# Patient Record
Sex: Male | Born: 1935 | Race: White | Hispanic: No | Marital: Married | Smoking: Former smoker
Health system: Southern US, Community
[De-identification: ages and names within clinical notes are randomized; demographics above are authoritative.]

## PROBLEM LIST (undated history)

## (undated) DIAGNOSIS — I341 Nonrheumatic mitral (valve) prolapse: Secondary | ICD-10-CM

## (undated) DIAGNOSIS — I10 Essential (primary) hypertension: Secondary | ICD-10-CM

## (undated) DIAGNOSIS — I351 Nonrheumatic aortic (valve) insufficiency: Secondary | ICD-10-CM

## (undated) DIAGNOSIS — I4819 Other persistent atrial fibrillation: Secondary | ICD-10-CM

## (undated) DIAGNOSIS — K4090 Unilateral inguinal hernia, without obstruction or gangrene, not specified as recurrent: Secondary | ICD-10-CM

## (undated) DIAGNOSIS — I7121 Aneurysm of the ascending aorta, without rupture: Secondary | ICD-10-CM

## (undated) DIAGNOSIS — I4821 Permanent atrial fibrillation: Principal | ICD-10-CM

## (undated) DIAGNOSIS — I712 Thoracic aortic aneurysm, without rupture: Secondary | ICD-10-CM

## (undated) HISTORY — DX: Permanent atrial fibrillation: I48.21

## (undated) HISTORY — DX: Thoracic aortic aneurysm, without rupture: I71.2

## (undated) HISTORY — DX: Nonrheumatic mitral (valve) prolapse: I34.1

## (undated) HISTORY — DX: Aneurysm of the ascending aorta, without rupture: I71.21

## (undated) HISTORY — DX: Essential (primary) hypertension: I10

## (undated) HISTORY — DX: Other persistent atrial fibrillation: I48.19

## (undated) HISTORY — DX: Nonrheumatic aortic (valve) insufficiency: I35.1

---
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
## 2007-04-22 ENCOUNTER — Emergency Department (HOSPITAL_COMMUNITY): Admission: EM | Admit: 2007-04-22 | Discharge: 2007-04-23 | Payer: Self-pay | Admitting: Emergency Medicine

## 2008-12-27 HISTORY — PX: CARPAL TUNNEL RELEASE: SHX101

## 2009-07-17 ENCOUNTER — Ambulatory Visit (HOSPITAL_BASED_OUTPATIENT_CLINIC_OR_DEPARTMENT_OTHER): Admission: RE | Admit: 2009-07-17 | Discharge: 2009-07-17 | Payer: Self-pay | Admitting: Orthopedic Surgery

## 2009-12-27 HISTORY — PX: OTHER SURGICAL HISTORY: SHX169

## 2010-12-15 ENCOUNTER — Ambulatory Visit
Admission: RE | Admit: 2010-12-15 | Discharge: 2010-12-15 | Payer: Self-pay | Source: Home / Self Care | Attending: Orthopedic Surgery | Admitting: Orthopedic Surgery

## 2011-04-04 LAB — BASIC METABOLIC PANEL
BUN: 32 mg/dL — ABNORMAL HIGH (ref 6–23)
Chloride: 106 mEq/L (ref 96–112)
Glucose, Bld: 128 mg/dL — ABNORMAL HIGH (ref 70–99)
Potassium: 3.7 mEq/L (ref 3.5–5.1)

## 2011-05-11 NOTE — Op Note (Signed)
Brandon Dean, Brandon Dean              ACCOUNT NO.:  [id number]   MEDICAL RECORD NO.:  [id number]          PATIENT TYPE:  AMB   LOCATION:  DSC                          FACILITY:  [facility]   PHYSICIAN:  Katy Fitch. Sypher, M.D. DATE OF BIRTH:  10-19-1936   DATE OF PROCEDURE:  07/17/2009  DATE OF DISCHARGE:                               OPERATIVE REPORT   PREOPERATIVE DIAGNOSIS:  Severe left carpal tunnel syndrome with  electrodiagnostic studies obtained June 25, 2009 documenting  prolongation of the motor and sensory latencies of the left median nerve  across the carpal canal at a profoundly abnormal level.   POSTOPERATIVE DIAGNOSIS:  Severe left carpal tunnel syndrome with  electrodiagnostic studies obtained June 25, 2009 documenting  prolongation of the motor and sensory latencies of the left median nerve  across the carpal canal at a profoundly abnormal level.   OPERATION:  Release of left transverse carpal ligament and exploration  of left thumb motor branch.   SURGEON:  Katy Fitch. Sypher, MD   ASSISTANT:  Marveen Reeks Dasnoit, PA-C   ANESTHESIA:  General by mask.   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  He presented for evaluation of his  hands specifically left hand complaints on June 19, 2009.  Dr. Waynard Edwards  reported a history of injuring his left upper extremity in a fall  November 17, 2008.  He wore a splint for a period of time.  Thereafter,  he noted loss of sensibility in his fingers and dysesthesias when he  would sleep at night.   He presented for upper extremity evaluation and was noted to have thenar  atrophy on the left and diminished sensibility in the median  distribution left and right.   He was referred to see Dr. Wadie Lessen for detailed electrodiagnostic  studies on June 25, 2009.  These documented a motor latency of 11.3  milliseconds on the  left, a sensory latency of 4.2 milliseconds and  severely diminished motor amplitude and sensory amplitudes on the left.   We advised Dr. Waynard Edwards to proceed with release of his left transverse  carpal ligament.  Preoperatively, we reminded him that with his degree  of severe neuropathy, we would need to wait at least 73 months to see the  recovery of his thenar musculature and improved sensibility.   With median neuropathy at this level we cannot guarantee complete  recovery.   After informed consent he is brought to the operating room at this time.   Initially, he was of the opinion that he might want regional anesthesia.  After a detailed informed consent by Dr. Jacklynn Bue, he elected to proceed  with general anesthesia.  After informed consent he is brought to the  operating room at this time.   PROCEDURE:  Ildefonso Keaney is brought to the operating room and placed  in supine position upon the operating table.   Following the induction of general anesthesia by mask technique, the  left arm was prepped with Betadine soap and solution  and sterilely  draped.  The left arm was exsanguinated with an Esmarch bandage and the  arterial tourniquet on the proximal left brachium inflated to 250 mmHg.  The procedure commenced with a short incision in the line of the ring  finger and the palm.  Subcutaneous tissues were carefully divided  revealing the palmar fascia.  This was split longitudinally to reveal  the common sensory branch of the median nerve.  These were followed back  to the transverse carpal ligament which was gently isolated from the  median nerve with a Insurance risk surveyor.   The transverse carpal ligament was released with scissors along its  ulnar border extending into the distal forearm.  The volar forearm  fascia was also released subcutaneously.   This widely opened the carpal canal.   The median nerve was invested in hypertrophic tenosynovium of the ulnar  bursa.  No  mass or predicaments were noted.   The motor branch was inspected visually distally.  There did not appear  to be any significant impedance.   The wound was then repaired with intradermal 3-0 Prolene.  A compressive  dressing was applied with a volar plaster splint maintaining the wrist  in 5 degrees of dorsiflexion.   For aftercare, Dr. Waynard Edwards was written a prescription for Percocet 5 mg  one p.o. q. 4-6 h. p.r.n. pain 20 tablets without refill.      Katy Fitch Sypher, M.D.  Electronically Signed     RVS/MEDQ  D:  07/17/2009  T:  07/18/2009  Job:  [id number]   cc:   Loraine Leriche A. Gonzalo, M.D.  Larina Earthly, M.D.

## 2012-03-08 HISTORY — PX: US ECHOCARDIOGRAPHY: HXRAD669

## 2013-01-12 ENCOUNTER — Other Ambulatory Visit: Payer: Self-pay | Admitting: Internal Medicine

## 2013-01-12 DIAGNOSIS — R9389 Abnormal findings on diagnostic imaging of other specified body structures: Secondary | ICD-10-CM

## 2013-02-12 ENCOUNTER — Other Ambulatory Visit: Payer: Self-pay

## 2013-02-15 ENCOUNTER — Other Ambulatory Visit: Payer: Self-pay

## 2014-06-26 ENCOUNTER — Other Ambulatory Visit: Payer: Self-pay | Admitting: Internal Medicine

## 2014-06-26 DIAGNOSIS — I712 Thoracic aortic aneurysm, without rupture, unspecified: Secondary | ICD-10-CM

## 2014-11-12 ENCOUNTER — Other Ambulatory Visit: Payer: Self-pay

## 2015-01-14 ENCOUNTER — Encounter: Payer: Self-pay | Admitting: *Deleted

## 2015-01-15 ENCOUNTER — Other Ambulatory Visit: Payer: Self-pay

## 2015-01-23 ENCOUNTER — Ambulatory Visit (INDEPENDENT_AMBULATORY_CARE_PROVIDER_SITE_OTHER): Payer: Medicare Other | Admitting: Cardiovascular Disease

## 2015-01-23 ENCOUNTER — Encounter: Payer: Self-pay | Admitting: Cardiovascular Disease

## 2015-01-23 VITALS — BP 132/98 | HR 101 | Resp 16 | Ht 69.0 in | Wt 195.0 lb

## 2015-01-23 DIAGNOSIS — I482 Chronic atrial fibrillation: Secondary | ICD-10-CM

## 2015-01-23 DIAGNOSIS — I4821 Permanent atrial fibrillation: Secondary | ICD-10-CM

## 2015-01-23 NOTE — Patient Instructions (Signed)
Your physician has requested that you regularly monitor and record your blood pressure readings at home. Please use the same machine at the same time of day to check your readings and record them to bring to your follow-up visit.  Dr. Sallyanne Kuster recommends that you schedule a follow-up appointment in: One year.

## 2015-01-24 ENCOUNTER — Encounter: Payer: Self-pay | Admitting: Cardiovascular Disease

## 2015-01-24 DIAGNOSIS — I4821 Permanent atrial fibrillation: Secondary | ICD-10-CM

## 2015-01-24 HISTORY — DX: Permanent atrial fibrillation: I48.21

## 2015-01-24 NOTE — Progress Notes (Signed)
Patient ID: Brandon Dean, male   DOB: June 10, 1936, 79 y.o.   MRN: [id number]      Reason for office visit Permanent atrial fibrillation  [name] returns for asymptomatic long-standing persistent (probably permanent) atrial fibrillation in the setting of long-standing systemic hypertension, mild aortic insufficiency, mild mitral valve prolapse with minimal insufficiency and ectasia of the ascending aorta. His left atrium is severely dilated  He remains physically very active, although bilateral knee arthritis from years of playing soccer have been slowing him down. He denies problems with palpitations, dizziness, lower extremity edema, shortness of breath or chest discomfort. He does not have a history of stroke, TIA or other embolic events. He developed lower GI bleeding with Xarelto (probably hemorrhoidal) but is tolerating Eliquis without such issues. He has not had other bleeding complications. At home his blood pressure monitor typically reads around 1:30/80 mmHg and his heart rate is typically in the 80s. Today in the office both his heart rate and blood pressure are higher than usual.  No Known Allergies  Current Outpatient Prescriptions  Medication Sig Dispense Refill  . Cholecalciferol (VITAMIN D) 2000 UNITS tablet Take 2,000 Units by mouth daily.    Marland Kitchen ELIQUIS 5 MG TABS tablet Take 5 mg by mouth 2 (two) times daily.   1  . folic acid (FOLVITE) 595 MCG tablet Take 400 mcg by mouth 2 (two) times daily.    . metoprolol succinate (TOPROL-XL) 50 MG 24 hr tablet Take 50 mg by mouth daily. Take with or immediately following a meal.     No current facility-administered medications for this visit.    Past Medical History  Diagnosis Date  . Persistent atrial fibrillation   . Ascending aortic aneurysm   . MVP (mitral valve prolapse)   . Mild aortic insufficiency   . Systemic hypertension   . Permanent atrial fibrillation 01/24/2015    Past Surgical History  Procedure Laterality Date   . Orif clavicle fracture  1946    left  . Carpal tunnel release  1946    left  . US echocardiography  03/08/2012    severe left atrial dilatation, moderate right atrial dilatation,mild aortic sclerosis,mild AI, mild bileaflet MVP,trace MR,mild aortic root dilatation.    Family History  Problem Relation Age of Onset  . Cancer Mother     breast  . Heart attack Father   . Heart failure Brother   . Hypertension Sister     History   Social History  . Marital Status: Married    Spouse Name: N/A    Number of Children: N/A  . Years of Education: N/A   Occupational History  . Not on file.   Social History Main Topics  . Smoking status: Former Smoker    Quit date: 12/26/1966  . Smokeless tobacco: Not on file  . Alcohol Use: 0.0 oz/week    0 Not specified per week     Comment: 2 glasses wine daily  . Drug Use: No  . Sexual Activity: Not on file   Other Topics Concern  . Not on file   Social History Narrative    Review of systems: The patient specifically denies any chest pain at rest or with exertion, dyspnea at rest or with exertion, orthopnea, paroxysmal nocturnal dyspnea, syncope, palpitations, focal neurological deficits, intermittent claudication, lower extremity edema, unexplained weight gain, cough, hemoptysis or wheezing.  The patient also denies abdominal pain, nausea, vomiting, dysphagia, diarrhea, constipation, polyuria, polydipsia, dysuria, hematuria, frequency, urgency, abnormal bleeding or bruising,  fever, chills, unexpected weight changes, mood swings, change in skin or hair texture, change in voice quality, auditory or visual problems, allergic reactions or rashes, new musculoskeletal complaints other than usual "aches and pains".   PHYSICAL EXAM BP 132/98 mmHg  Pulse 101  Resp 16  Ht _0  (1.753 m)  Wt 195 lb (88.451 kg)  BMI 28.78 kg/m2  General: Alert, oriented x3, no distress Head: no evidence of trauma, PERRL, EOMI, no exophtalmos or lid lag, no  myxedema, no xanthelasma; normal ears, nose and oropharynx Neck: normal jugular venous pulsations and no hepatojugular reflux; brisk carotid pulses without delay and no carotid bruits Chest: clear to auscultation, no signs of consolidation by percussion or palpation, normal fremitus, symmetrical and full respiratory excursions Cardiovascular: normal position and quality of the apical impulse, irregular rhythm, normal first and second heart sounds, no murmurs, rubs or gallops Abdomen: no tenderness or distention, no masses by palpation, no abnormal pulsatility or arterial bruits, normal bowel sounds, no hepatosplenomegaly Extremities: no clubbing, cyanosis or edema; 2+ radial, ulnar and brachial pulses bilaterally; 2+ right femoral, posterior tibial and dorsalis pedis pulses; 2+ left femoral, posterior tibial and dorsalis pedis pulses; no subclavian or femoral bruits Neurological: grossly nonfocal   EKG: Atrial fibrillation with ventricular rate around the 100 bpm a single PVC is seen otherwise normal tracing  Lipid Panel  No results found for: CHOL, TRIG, HDL, CHOLHDL, VLDL, LDLCALC, LDLDIRECT  BMET    Component Value Date/Time   NA 139 07/14/2009 0935   K 3.7 07/14/2009 0935   CL 106 07/14/2009 0935   CO2 27 07/14/2009 0935   GLUCOSE 128* 07/14/2009 0935   BUN 32* 07/14/2009 0935   CREATININE 0.85 07/14/2009 0935   CALCIUM 9.2 07/14/2009 0935   GFRNONAA >60 07/14/2009 0935   GFRAA  07/14/2009 0935    >60        The eGFR has been calculated using the MDRD equation. This calculation has not been validated in all clinical situations. eGFR's persistently <60 mL/min signify possible Chronic Kidney Disease.     ASSESSMENT AND PLAN  [name] has asymptomatic atrial fibrillation without a history of embolic events, compliant with effective anticoagulation. He usually has good rate control as well as good blood pressure control, but today both heart rate and blood pressure higher  than average.  Asked him to check his resting, relaxed blood pressure and heart rate once a day and send me the recordings either via the [email or web address] website or mail.  Orders Placed This Encounter  Procedures  . EKG 12-Lead   Meds ordered this encounter  Medications  . ELIQUIS 5 MG TABS tablet    Sig: Take 5 mg by mouth 2 (two) times daily.     Refill:  1  . Cholecalciferol (VITAMIN D) 2000 UNITS tablet    Sig: Take 2,000 Units by mouth daily.    Holli Humbles, MD, [place] [phone] office [phone] pager

## 2015-01-28 ENCOUNTER — Encounter: Payer: Self-pay | Admitting: Cardiovascular Disease

## 2015-02-25 ENCOUNTER — Other Ambulatory Visit: Payer: Self-pay | Admitting: Internal Medicine

## 2015-02-25 DIAGNOSIS — I712 Thoracic aortic aneurysm, without rupture, unspecified: Secondary | ICD-10-CM

## 2015-02-26 ENCOUNTER — Ambulatory Visit
Admission: RE | Admit: 2015-02-26 | Discharge: 2015-02-26 | Disposition: A | Discharge status: Home / Self Care | Payer: Medicare Other | Source: Ambulatory Visit | Attending: Internal Medicine | Admitting: Internal Medicine

## 2015-02-26 DIAGNOSIS — I712 Thoracic aortic aneurysm, without rupture, unspecified: Secondary | ICD-10-CM

## 2015-02-26 MED ORDER — IOHEXOL 350 MG/ML SOLN
75.0000 mL | Freq: Once | INTRAVENOUS | Status: AC | PRN
Start: 2015-02-26 — End: 2015-02-26
  Administered 2015-02-26: 75 mL via INTRAVENOUS

## 2015-03-12 ENCOUNTER — Encounter: Payer: Self-pay | Admitting: Cardiovascular Disease

## 2015-03-12 ENCOUNTER — Encounter (INDEPENDENT_AMBULATORY_CARE_PROVIDER_SITE_OTHER): Payer: Self-pay

## 2015-03-12 ENCOUNTER — Telehealth: Payer: Self-pay | Admitting: Cardiovascular Disease

## 2015-03-12 NOTE — Telephone Encounter (Signed)
Pt says he need to e mail Dr C his blood pressure readings. He said he need the e mail address. He says if he does not answer his phone,please leave it on his voice mail.

## 2015-03-12 NOTE — Telephone Encounter (Signed)
Lost his paperwork from his last visit.  Requested it be sent to him.  Will set up MyChart once he receives the paperwork and will either send or drop of his BP readings.  Does not want to leave them over the phone.

## 2015-04-03 ENCOUNTER — Telehealth: Payer: Self-pay | Admitting: *Deleted

## 2015-04-03 NOTE — Telephone Encounter (Signed)
LM with Dr. Lurline Del reponse to home BP readings of 100/67 - 126/98.  Only two reading high from 01/24/15 to 02/22/15.  BP readings look great.  No changes recommended.

## 2015-10-07 ENCOUNTER — Other Ambulatory Visit: Payer: Self-pay | Admitting: Internal Medicine

## 2015-10-07 DIAGNOSIS — R634 Abnormal weight loss: Secondary | ICD-10-CM

## 2015-10-07 DIAGNOSIS — K769 Liver disease, unspecified: Secondary | ICD-10-CM

## 2015-10-07 DIAGNOSIS — R945 Abnormal results of liver function studies: Secondary | ICD-10-CM

## 2015-10-07 DIAGNOSIS — N289 Disorder of kidney and ureter, unspecified: Secondary | ICD-10-CM

## 2015-10-08 ENCOUNTER — Other Ambulatory Visit: Payer: Medicare Other

## 2015-10-14 ENCOUNTER — Ambulatory Visit
Admission: RE | Admit: 2015-10-14 | Discharge: 2015-10-14 | Disposition: A | Discharge status: Home / Self Care | Payer: Medicare Other | Source: Ambulatory Visit | Attending: Internal Medicine | Admitting: Internal Medicine

## 2015-10-14 DIAGNOSIS — R945 Abnormal results of liver function studies: Secondary | ICD-10-CM

## 2015-10-14 DIAGNOSIS — N289 Disorder of kidney and ureter, unspecified: Secondary | ICD-10-CM

## 2015-10-14 DIAGNOSIS — K769 Liver disease, unspecified: Secondary | ICD-10-CM

## 2015-10-14 DIAGNOSIS — R634 Abnormal weight loss: Secondary | ICD-10-CM

## 2015-10-15 ENCOUNTER — Other Ambulatory Visit: Payer: Self-pay | Admitting: Internal Medicine

## 2015-10-15 DIAGNOSIS — K769 Liver disease, unspecified: Secondary | ICD-10-CM

## 2015-10-15 DIAGNOSIS — R945 Abnormal results of liver function studies: Secondary | ICD-10-CM

## 2015-10-15 DIAGNOSIS — R634 Abnormal weight loss: Secondary | ICD-10-CM

## 2015-10-15 DIAGNOSIS — N289 Disorder of kidney and ureter, unspecified: Secondary | ICD-10-CM

## 2015-10-20 ENCOUNTER — Telehealth: Payer: Self-pay | Admitting: Cardiovascular Disease

## 2015-10-20 NOTE — Telephone Encounter (Signed)
Received records from [facility] for appointment on 11/14/15 with Dr Sallyanne Kuster.  Records given to [facility] for Dr Croitoru's schedule on 11/14/15. lp

## 2015-10-21 ENCOUNTER — Ambulatory Visit
Admission: RE | Admit: 2015-10-21 | Discharge: 2015-10-21 | Disposition: A | Discharge status: Home / Self Care | Payer: Medicare Other | Source: Ambulatory Visit | Attending: Internal Medicine | Admitting: Internal Medicine

## 2015-10-21 DIAGNOSIS — N289 Disorder of kidney and ureter, unspecified: Secondary | ICD-10-CM

## 2015-10-21 DIAGNOSIS — K769 Liver disease, unspecified: Secondary | ICD-10-CM

## 2015-10-21 DIAGNOSIS — R634 Abnormal weight loss: Secondary | ICD-10-CM

## 2015-10-21 DIAGNOSIS — R945 Abnormal results of liver function studies: Secondary | ICD-10-CM

## 2015-10-21 MED ORDER — IOPAMIDOL (ISOVUE-300) INJECTION 61%
100.0000 mL | Freq: Once | INTRAVENOUS | Status: AC | PRN
  Administered 2015-10-21: 100 mL via INTRAVENOUS

## 2015-10-22 ENCOUNTER — Telehealth: Payer: Self-pay | Admitting: Gastroenterology

## 2015-10-22 DIAGNOSIS — R932 Abnormal findings on diagnostic imaging of liver and biliary tract: Secondary | ICD-10-CM

## 2015-10-22 NOTE — Telephone Encounter (Signed)
Brandon Dean, thanks.    Brandon Dean (not sure who's covering Brandon Dean today) Can you get Brandon Dean in for [facility] office visit with me late this AM?  I am in [place] but should be fine for an 11am appt or 11:15 appt.  He needs labs drawn earlier, sent stat (cbc, cmet, inr, CA 19-9).  Also tell Brandon Dean to NOT take his eliquis today if he hasn't already taken it.  Lastly can you check with [facility] about ERCP add on to my last case of the morning tomorrow, block the spot tentatively for now.  Thanks   Thanks

## 2015-10-22 NOTE — Telephone Encounter (Signed)
-----   Message from Manus Gunning, MD sent at 10/22/2015  7:11 AM EDT ----- Regarding: new suspected cholangio Melissa Montane,  Got a call last night from [facility], one of the internists at [facility]. 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 He asked if [facility] could see him, although he specifically requested you or Deatra Ina since he knows you both. He doesn't have LFTs and needs those, but I think he wants tissue to confirm the diagnosis and stenting if obstructed. He's otherwise on AF with Eliquis on board.   Name is Xavien Dauphinais DOB Aug 30, 1936. Patient's phone is [phone].   If I can do anything let me know, or if some reason you aren't interested or can't accommodate him I can ask one of the other guys. Thanks  Richardson Landry

## 2015-10-22 NOTE — Telephone Encounter (Signed)
Attempted to call patient x 2.  I left messages x 2.

## 2015-10-22 NOTE — Telephone Encounter (Signed)
Dr. Ardis Hughs spoke with Dr. Joylene Draft.  He will have his father here tomorrow for labs at 12:00 then office visit after.  New lab orders entered.

## 2015-10-23 ENCOUNTER — Encounter: Payer: Self-pay | Admitting: Gastroenterology

## 2015-10-23 ENCOUNTER — Other Ambulatory Visit (INDEPENDENT_AMBULATORY_CARE_PROVIDER_SITE_OTHER): Payer: Medicare Other

## 2015-10-23 ENCOUNTER — Ambulatory Visit (INDEPENDENT_AMBULATORY_CARE_PROVIDER_SITE_OTHER): Payer: Medicare Other | Admitting: Gastroenterology

## 2015-10-23 VITALS — BP 124/74 | HR 59 | Ht 69.0 in

## 2015-10-23 DIAGNOSIS — R932 Abnormal findings on diagnostic imaging of liver and biliary tract: Secondary | ICD-10-CM | POA: Diagnosis not present

## 2015-10-23 DIAGNOSIS — R938 Abnormal findings on diagnostic imaging of other specified body structures: Secondary | ICD-10-CM | POA: Diagnosis not present

## 2015-10-23 DIAGNOSIS — R9389 Abnormal findings on diagnostic imaging of other specified body structures: Secondary | ICD-10-CM

## 2015-10-23 LAB — CBC WITH DIFFERENTIAL/PLATELET
BASOS PCT: 0.9 % (ref 0.0–3.0)
Basophils Absolute: 0.1 10*3/uL (ref 0.0–0.1)
EOS ABS: 0 10*3/uL (ref 0.0–0.7)
Eosinophils Relative: 0.3 % (ref 0.0–5.0)
HCT: 32.2 % — ABNORMAL LOW (ref 39.0–52.0)
Hemoglobin: 10.7 g/dL — ABNORMAL LOW (ref 13.0–17.0)
Lymphocytes Relative: 6.9 % — ABNORMAL LOW (ref 12.0–46.0)
Lymphs Abs: 0.9 10*3/uL (ref 0.7–4.0)
MCHC: 33.1 g/dL (ref 30.0–36.0)
MCV: 82.1 fl (ref 78.0–100.0)
MONO ABS: 1 10*3/uL (ref 0.1–1.0)
Monocytes Relative: 7.8 % (ref 3.0–12.0)
NEUTROS ABS: 11.2 10*3/uL — AB (ref 1.4–7.7)
NEUTROS PCT: 84.1 % — AB (ref 43.0–77.0)
RBC: 3.92 Mil/uL — ABNORMAL LOW (ref 4.22–5.81)
RDW: 15.1 % (ref 11.5–15.5)
WBC: 13.3 10*3/uL — ABNORMAL HIGH (ref 4.0–10.5)

## 2015-10-23 LAB — COMPREHENSIVE METABOLIC PANEL
ALT: 28 U/L (ref 0–53)
AST: 38 U/L — AB (ref 0–37)
Albumin: 3.1 g/dL — ABNORMAL LOW (ref 3.5–5.2)
Alkaline Phosphatase: 420 U/L — ABNORMAL HIGH (ref 39–117)
BUN: 34 mg/dL — AB (ref 6–23)
CHLORIDE: 101 meq/L (ref 96–112)
CO2: 23 meq/L (ref 19–32)
CREATININE: 0.75 mg/dL (ref 0.40–1.50)
Calcium: 8.8 mg/dL (ref 8.4–10.5)
GFR: 107.11 mL/min (ref >60.00)
GLUCOSE: 118 mg/dL — AB (ref 70–99)
Potassium: 4.2 mEq/L (ref 3.5–5.1)
SODIUM: 133 meq/L — AB (ref 135–145)
Total Bilirubin: 1.5 mg/dL — ABNORMAL HIGH (ref 0.2–1.2)
Total Protein: 6.3 g/dL (ref 6.0–8.3)

## 2015-10-23 LAB — PROTIME-INR
INR: 2.2 ratio — ABNORMAL HIGH (ref 0.8–1.0)
PROTHROMBIN TIME: 23.8 s — AB (ref 9.6–13.1)

## 2015-10-23 NOTE — Patient Instructions (Signed)
You have been scheduled for an MRI at [facility] 1st Floor Radiology on 10/27/2015. Your appointment time is 11:00am. Please arrive 15 minutes prior to your appointment time for registration purposes. Please make certain not to have anything to eat or drink 6 hours prior to your test. In addition, if you have any metal in your body, have a pacemaker or defibrillator, please be sure to let your ordering physician know. This test typically takes 45 minutes to 1 hour to complete.

## 2015-10-23 NOTE — Progress Notes (Signed)
HPI: This is a  very pleasant 79 year old man    who was referred to me by Brandon Solian, MD  to evaluate  abnormal CT scan .    Chief complaint is groin pain, left back pain, abnormal CT scan  This is the first time I'm meeting him. He is here with his wife today his son is Dr. Crist Dean who is an internist here in town.  [name] has had a large scrotal hernia for 15-20 years. It has become more bothersome over the past year or 2 and particularly bothersome the past month or 2. He went to see a Psychologist, sport and exercise here in town and he was preparing to have it fixed at some point in the near future after clearance for surgery.  He had CT angiogram of the chest and abdomen 7 months ago. This was done for aneurysm evaluation per the CT reports. It was noted that he had unusual appearance of his left lobe of liver concerning for possible isolated bile duct dilation. He was recommended to have further imaging such as an MRI. He tried to have an MRI 2 or 3 weeks ago it sounds like that he had a lot of pain in his lower back and cannot sit still long enough for a complete examination.  He instead underwent CT scan of his abdomen and pelvis. See that result below.  He believes he has lost about 10 pounds in the past 2 or 3 months. In addition to his scrotal pain he has left flank, left back pain that he believes is from muscular soreness from the massive scrotal hernia. He also has generalized fatigue. He is eating without pain. He has no pruritus. He has not had jaundice.   CT scan 09/2015 (with IV and PO contrast): 1. Abnormal enhancement along the walls of the distended common bile duct highly suspicious for cholangiocarcinoma. Recommend ERCP or MRCP for further characterization. 2. Prominent bile duct dilatation within the left hepatic lobe, further evidence for a central cholangiocarcinoma. 3. Numerous small hypodense lesions throughout the periphery of the bilateral liver lobes, highly suspicious for  metastatic disease. An additional mass within the far medial aspects of the left hepatic lobe, measuring 3.3 x 2.4 cm, shows no evidence of significant enhancement and may merely represent a confluence of dilated bile ducts or complex cyst. 4. Multiple pulmonary nodules at each lung base, right greater than left, highly suspicious for pulmonary metastases. Additional small pleural effusions seen bilaterally. 5. Small amount of free fluid within the abdomen and pelvis. 6. Large left inguinal hernia which contains multiple small bowel loops, extending to the left scrotum. No evidence of associated bowel obstruction or inflammation within this large hernia sac. 7. Numerous renal cysts bilaterally, some of which are hemorrhagic cysts. No suspicious enhancing renal mass. 8. Scattered small and moderate-sized lymph nodes within the upper abdomen and retroperitoneum, suspicious for metastatic lymphadenopathy.  Review of systems: Pertinent positive and negative review of systems were noted in the above HPI section. Complete review of systems was performed and was otherwise normal.   Past Medical History  Diagnosis Date  . Persistent atrial fibrillation ([place])   . Ascending aortic aneurysm ([place])   . MVP (mitral valve prolapse)   . Mild aortic insufficiency   . Systemic hypertension   . Permanent atrial fibrillation ([place]) 01/24/2015    Past Surgical History  Procedure Laterality Date  . Orif clavicle fracture  1946    left  . Carpal tunnel release  1946  left  . US echocardiography  03/08/2012    severe left atrial dilatation, moderate right atrial dilatation,mild aortic sclerosis,mild AI, mild bileaflet MVP,trace MR,mild aortic root dilatation.    Current Outpatient Prescriptions  Medication Sig Dispense Refill  . Cholecalciferol (VITAMIN D) 2000 UNITS tablet Take 2,000 Units by mouth daily.    Marland Kitchen ELIQUIS 5 MG TABS tablet Take 5 mg by mouth 2 (two) times daily.   1  . folic acid  (FOLVITE) 789 MCG tablet Take 400 mcg by mouth 2 (two) times daily.    . metoprolol succinate (TOPROL-XL) 50 MG 24 hr tablet Take 50 mg by mouth daily. Take with or immediately following a meal.     No current facility-administered medications for this visit.    Allergies as of 10/23/2015  . (No Known Allergies)    Family History  Problem Relation Age of Onset  . Cancer Mother     breast  . Heart attack Father   . Heart failure Brother   . Hypertension Sister     Social History   Social History  . Marital Status: Married    Spouse Name: N/A  . Number of Children: N/A  . Years of Education: N/A   Occupational History  . Not on file.   Social History Main Topics  . Smoking status: Former Smoker    Quit date: 12/26/1966  . Smokeless tobacco: Not on file  . Alcohol Use: 0.0 oz/week    0 Standard drinks or equivalent per week     Comment: 2 glasses wine daily  . Drug Use: No  . Sexual Activity: Not on file   Other Topics Concern  . Not on file   Social History Narrative     Physical Exam: BP 124/74 mmHg  Pulse 59  Ht 5\' 9"  (1.753 m) Constitutional: generally well-appearing, anicteric sclera Psychiatric: alert and oriented x3, sitting in wheelchair Eyes: extraocular movements intact Mouth: oral pharynx moist, no lesions Neck: supple no lymphadenopathy Cardiovascular: heart regular rate and rhythm Lungs: clear to auscultation bilaterally Abdomen: soft, nontender, nondistended, no obvious ascites, no peritoneal signs, normal bowel sounds Extremities: no lower extremity edema bilaterally Skin: no lesions on visible extremities Massive scrotal hernia that is not tender on examination there is no focal tenderness at his left back, left flank ribs.   Assessment and plan: 79 y.o. male with  abnormal CT scan abdomen and pelvis concerning for possible cholangiocarcinoma with metastasis throughout the liver, peritoneum, and possibly lower lungs as well.  I reviewed  the CT scan report and images personally. I also spoke with one of the interventional radiologist on the phone today. Biliary dilation seems confined to the left lobe. The common bile duct does not look normal either but I'm not sure that that is site of obstruction actually. The multiple lesions throughout his liver are also quite small and hard to characterize.  MRI of the abdomen with MRCP images on to give a much better understanding of what is going on his bile ducts and in his liver and will aid to target tissue acquisition best. I advised him to take 2 of his valium about a 1/2 prior to the MRI so that he is able to have a complete examination this time.  He had a battery of blood tests done today including a CBC, complete metabolic profile, coags and CA-19-9. Those are not back yet. He does not clinically appear to be jaundiced fortunately and he has no pruritus.  His biggest complaint  is that of discomfort from his massive scrotal hernia and I can understand why. It truly is huge. He was asking if this can still be fixed even if he has cancer and I recommended first that we do appropriate diagnosis and staging workup. It certainly doesn't seem unreasonable to fix this massive hernia even if he has underlying metastatic cancer because it is causing a lot of discomfort, significantly impacting his quality of life while at this point at least he seems to have no symptoms from the hepatobiliary process.   Owens Loffler, MD [facility] 10/23/2015, 12:49 PM  Cc: Brandon Solian, MD

## 2015-10-24 ENCOUNTER — Encounter (HOSPITAL_COMMUNITY): Payer: Self-pay | Admitting: Emergency Medicine

## 2015-10-24 ENCOUNTER — Emergency Department (HOSPITAL_COMMUNITY): Payer: Medicare Other

## 2015-10-24 ENCOUNTER — Other Ambulatory Visit: Payer: Self-pay

## 2015-10-24 ENCOUNTER — Inpatient Hospital Stay (HOSPITAL_COMMUNITY)
Admission: EM | Admit: 2015-10-24 | Discharge: 2015-11-27 | DRG: 175 | Disposition: E | Payer: Medicare Other | Attending: Internal Medicine | Admitting: Internal Medicine

## 2015-10-24 ENCOUNTER — Telehealth: Payer: Self-pay | Admitting: Gastroenterology

## 2015-10-24 DIAGNOSIS — Z8249 Family history of ischemic heart disease and other diseases of the circulatory system: Secondary | ICD-10-CM | POA: Diagnosis not present

## 2015-10-24 DIAGNOSIS — I2609 Other pulmonary embolism with acute cor pulmonale: Secondary | ICD-10-CM | POA: Diagnosis present

## 2015-10-24 DIAGNOSIS — D6869 Other thrombophilia: Secondary | ICD-10-CM | POA: Diagnosis present

## 2015-10-24 DIAGNOSIS — C221 Intrahepatic bile duct carcinoma: Secondary | ICD-10-CM | POA: Diagnosis not present

## 2015-10-24 DIAGNOSIS — R160 Hepatomegaly, not elsewhere classified: Secondary | ICD-10-CM | POA: Diagnosis not present

## 2015-10-24 DIAGNOSIS — I63511 Cerebral infarction due to unspecified occlusion or stenosis of right middle cerebral artery: Secondary | ICD-10-CM | POA: Diagnosis not present

## 2015-10-24 DIAGNOSIS — R188 Other ascites: Secondary | ICD-10-CM | POA: Diagnosis present

## 2015-10-24 DIAGNOSIS — I959 Hypotension, unspecified: Secondary | ICD-10-CM | POA: Diagnosis present

## 2015-10-24 DIAGNOSIS — K4090 Unilateral inguinal hernia, without obstruction or gangrene, not specified as recurrent: Secondary | ICD-10-CM | POA: Diagnosis present

## 2015-10-24 DIAGNOSIS — Z79899 Other long term (current) drug therapy: Secondary | ICD-10-CM

## 2015-10-24 DIAGNOSIS — K839 Disease of biliary tract, unspecified: Secondary | ICD-10-CM | POA: Insufficient documentation

## 2015-10-24 DIAGNOSIS — Z515 Encounter for palliative care: Secondary | ICD-10-CM | POA: Diagnosis present

## 2015-10-24 DIAGNOSIS — D649 Anemia, unspecified: Secondary | ICD-10-CM | POA: Diagnosis present

## 2015-10-24 DIAGNOSIS — R0902 Hypoxemia: Secondary | ICD-10-CM | POA: Diagnosis not present

## 2015-10-24 DIAGNOSIS — I341 Nonrheumatic mitral (valve) prolapse: Secondary | ICD-10-CM | POA: Diagnosis present

## 2015-10-24 DIAGNOSIS — I10 Essential (primary) hypertension: Secondary | ICD-10-CM

## 2015-10-24 DIAGNOSIS — Z888 Allergy status to other drugs, medicaments and biological substances status: Secondary | ICD-10-CM | POA: Diagnosis not present

## 2015-10-24 DIAGNOSIS — R932 Abnormal findings on diagnostic imaging of liver and biliary tract: Secondary | ICD-10-CM | POA: Diagnosis not present

## 2015-10-24 DIAGNOSIS — Z87891 Personal history of nicotine dependence: Secondary | ICD-10-CM

## 2015-10-24 DIAGNOSIS — Z66 Do not resuscitate: Secondary | ICD-10-CM | POA: Diagnosis not present

## 2015-10-24 DIAGNOSIS — I2699 Other pulmonary embolism without acute cor pulmonale: Secondary | ICD-10-CM | POA: Diagnosis present

## 2015-10-24 DIAGNOSIS — R4182 Altered mental status, unspecified: Secondary | ICD-10-CM

## 2015-10-24 DIAGNOSIS — D696 Thrombocytopenia, unspecified: Secondary | ICD-10-CM | POA: Diagnosis present

## 2015-10-24 DIAGNOSIS — Z8505 Personal history of malignant neoplasm of liver: Secondary | ICD-10-CM | POA: Diagnosis not present

## 2015-10-24 DIAGNOSIS — R978 Other abnormal tumor markers: Secondary | ICD-10-CM

## 2015-10-24 DIAGNOSIS — K831 Obstruction of bile duct: Secondary | ICD-10-CM | POA: Diagnosis present

## 2015-10-24 DIAGNOSIS — E785 Hyperlipidemia, unspecified: Secondary | ICD-10-CM | POA: Diagnosis present

## 2015-10-24 DIAGNOSIS — R109 Unspecified abdominal pain: Secondary | ICD-10-CM | POA: Diagnosis present

## 2015-10-24 DIAGNOSIS — R531 Weakness: Secondary | ICD-10-CM

## 2015-10-24 DIAGNOSIS — I482 Chronic atrial fibrillation: Secondary | ICD-10-CM | POA: Diagnosis present

## 2015-10-24 DIAGNOSIS — Z7901 Long term (current) use of anticoagulants: Secondary | ICD-10-CM | POA: Diagnosis not present

## 2015-10-24 DIAGNOSIS — I481 Persistent atrial fibrillation: Secondary | ICD-10-CM | POA: Diagnosis present

## 2015-10-24 DIAGNOSIS — K5900 Constipation, unspecified: Secondary | ICD-10-CM | POA: Diagnosis present

## 2015-10-24 DIAGNOSIS — Z79891 Long term (current) use of opiate analgesic: Secondary | ICD-10-CM | POA: Diagnosis not present

## 2015-10-24 DIAGNOSIS — C799 Secondary malignant neoplasm of unspecified site: Secondary | ICD-10-CM | POA: Diagnosis present

## 2015-10-24 DIAGNOSIS — I351 Nonrheumatic aortic (valve) insufficiency: Secondary | ICD-10-CM | POA: Diagnosis present

## 2015-10-24 DIAGNOSIS — I63411 Cerebral infarction due to embolism of right middle cerebral artery: Secondary | ICD-10-CM | POA: Diagnosis not present

## 2015-10-24 DIAGNOSIS — K8070 Calculus of gallbladder and bile duct without cholecystitis without obstruction: Secondary | ICD-10-CM | POA: Diagnosis present

## 2015-10-24 DIAGNOSIS — I712 Thoracic aortic aneurysm, without rupture: Secondary | ICD-10-CM | POA: Diagnosis present

## 2015-10-24 DIAGNOSIS — Z803 Family history of malignant neoplasm of breast: Secondary | ICD-10-CM | POA: Diagnosis not present

## 2015-10-24 HISTORY — DX: Unilateral inguinal hernia, without obstruction or gangrene, not specified as recurrent: K40.90

## 2015-10-24 LAB — COMPREHENSIVE METABOLIC PANEL
ALK PHOS: 440 U/L — AB (ref 38–126)
ALT: 30 U/L (ref 17–63)
AST: 47 U/L — AB (ref 15–41)
Albumin: 3 g/dL — ABNORMAL LOW (ref 3.5–5.0)
Anion gap: 9 (ref 5–15)
BILIRUBIN TOTAL: 1.4 mg/dL — AB (ref 0.3–1.2)
BUN: 35 mg/dL — AB (ref 6–20)
CALCIUM: 8.7 mg/dL — AB (ref 8.9–10.3)
CO2: 21 mmol/L — ABNORMAL LOW (ref 22–32)
CREATININE: 0.72 mg/dL (ref 0.61–1.24)
Chloride: 103 mmol/L (ref 101–111)
Glucose, Bld: 115 mg/dL — ABNORMAL HIGH (ref 65–99)
Potassium: 4.1 mmol/L (ref 3.5–5.1)
Sodium: 133 mmol/L — ABNORMAL LOW (ref 135–145)
TOTAL PROTEIN: 6.2 g/dL — AB (ref 6.5–8.1)

## 2015-10-24 LAB — CBC WITH DIFFERENTIAL/PLATELET
BASOS PCT: 0 %
Basophils Absolute: 0 10*3/uL (ref 0.0–0.1)
EOS PCT: 0 %
Eosinophils Absolute: 0 10*3/uL (ref 0.0–0.7)
HEMATOCRIT: 29.5 % — AB (ref 39.0–52.0)
Hemoglobin: 9.8 g/dL — ABNORMAL LOW (ref 13.0–17.0)
Lymphocytes Relative: 6 %
Lymphs Abs: 0.8 10*3/uL (ref 0.7–4.0)
MCH: 27.5 pg (ref 26.0–34.0)
MCHC: 33.2 g/dL (ref 30.0–36.0)
MCV: 82.9 fL (ref 78.0–100.0)
MONO ABS: 1.2 10*3/uL — AB (ref 0.1–1.0)
MONOS PCT: 9 %
NEUTROS PCT: 85 %
Neutro Abs: 10.8 10*3/uL — ABNORMAL HIGH (ref 1.7–7.7)
Platelets: 54 10*3/uL — ABNORMAL LOW (ref 150–400)
RBC: 3.56 MIL/uL — AB (ref 4.22–5.81)
RDW: 15.5 % (ref 11.5–15.5)
WBC: 12.8 10*3/uL — AB (ref 4.0–10.5)

## 2015-10-24 LAB — URINE MICROSCOPIC-ADD ON

## 2015-10-24 LAB — URINALYSIS, ROUTINE W REFLEX MICROSCOPIC
Glucose, UA: NEGATIVE mg/dL
KETONES UR: NEGATIVE mg/dL
LEUKOCYTES UA: NEGATIVE
NITRITE: NEGATIVE
PH: 5.5 (ref 5.0–8.0)
Protein, ur: 100 mg/dL — AB
Specific Gravity, Urine: 1.025 (ref 1.005–1.030)
Urobilinogen, UA: 1 mg/dL (ref 0.0–1.0)

## 2015-10-24 LAB — CANCER ANTIGEN 19-9: CA 19-9: 690.3 U/mL — ABNORMAL HIGH (ref <35.0)

## 2015-10-24 LAB — I-STAT TROPONIN, ED: TROPONIN I, POC: 0.16 ng/mL — AB (ref 0.00–0.08)

## 2015-10-24 LAB — I-STAT CG4 LACTIC ACID, ED: LACTIC ACID, VENOUS: 1.19 mmol/L (ref 0.5–2.0)

## 2015-10-24 LAB — PROTIME-INR
INR: 2.32 — AB (ref 0.00–1.49)
Prothrombin Time: 25.2 seconds — ABNORMAL HIGH (ref 11.6–15.2)

## 2015-10-24 LAB — HEPARIN LEVEL (UNFRACTIONATED)

## 2015-10-24 LAB — BRAIN NATRIURETIC PEPTIDE: B NATRIURETIC PEPTIDE 5: 490.6 pg/mL — AB (ref 0.0–100.0)

## 2015-10-24 LAB — MRSA PCR SCREENING: MRSA by PCR: NEGATIVE

## 2015-10-24 LAB — APTT: aPTT: 44 seconds — ABNORMAL HIGH (ref 24–37)

## 2015-10-24 MED ORDER — METOPROLOL SUCCINATE ER 25 MG PO TB24
50.0000 mg | ORAL_TABLET | Freq: Every day | ORAL | Status: DC
  Administered 2015-10-25: 50 mg via ORAL
  Filled 2015-10-24: qty 2

## 2015-10-24 MED ORDER — FENTANYL CITRATE (PF) 100 MCG/2ML IJ SOLN
50.0000 ug | INTRAMUSCULAR | Status: DC | PRN
  Administered 2015-10-24: 50 ug via INTRAVENOUS
  Filled 2015-10-24: qty 2

## 2015-10-24 MED ORDER — ACETAMINOPHEN 325 MG PO TABS: 650.0000 mg | ORAL_TABLET | Freq: Four times a day (QID) | ORAL | Status: DC | PRN

## 2015-10-24 MED ORDER — SODIUM CHLORIDE 0.9 % IJ SOLN: 3.0000 mL | INTRAMUSCULAR | Status: DC | PRN

## 2015-10-24 MED ORDER — HEPARIN (PORCINE) IN NACL 100-0.45 UNIT/ML-% IJ SOLN
1300.0000 [IU]/h | INTRAMUSCULAR | Status: DC
  Filled 2015-10-24: qty 250

## 2015-10-24 MED ORDER — ALUM & MAG HYDROXIDE-SIMETH 200-200-20 MG/5ML PO SUSP: 30.0000 mL | Freq: Four times a day (QID) | ORAL | Status: DC | PRN

## 2015-10-24 MED ORDER — ASPIRIN 81 MG PO CHEW
324.0000 mg | CHEWABLE_TABLET | Freq: Once | ORAL | Status: AC
  Administered 2015-10-24: 324 mg via ORAL
  Filled 2015-10-24: qty 4

## 2015-10-24 MED ORDER — SODIUM CHLORIDE 0.9 % IJ SOLN
3.0000 mL | Freq: Two times a day (BID) | INTRAMUSCULAR | Status: DC
  Administered 2015-10-26 – 2015-10-27 (×3): 3 mL via INTRAVENOUS

## 2015-10-24 MED ORDER — SODIUM CHLORIDE 0.9 % IV BOLUS (SEPSIS)
500.0000 mL | Freq: Once | INTRAVENOUS | Status: AC
  Administered 2015-10-24: 500 mL via INTRAVENOUS

## 2015-10-24 MED ORDER — OXYCODONE HCL 5 MG PO TABS: 5.0000 mg | ORAL_TABLET | ORAL | Status: DC | PRN

## 2015-10-24 MED ORDER — CIPROFLOXACIN HCL 500 MG PO TABS: 500.0000 mg | ORAL_TABLET | Freq: Two times a day (BID) | ORAL | Status: AC

## 2015-10-24 MED ORDER — ONDANSETRON HCL 4 MG/2ML IJ SOLN: 4.0000 mg | Freq: Four times a day (QID) | INTRAMUSCULAR | Status: DC | PRN

## 2015-10-24 MED ORDER — ONDANSETRON HCL 4 MG/2ML IJ SOLN
4.0000 mg | INTRAMUSCULAR | Status: DC | PRN
  Administered 2015-10-24: 4 mg via INTRAVENOUS
  Filled 2015-10-24: qty 2

## 2015-10-24 MED ORDER — ACETAMINOPHEN 650 MG RE SUPP: 650.0000 mg | Freq: Four times a day (QID) | RECTAL | Status: DC | PRN

## 2015-10-24 MED ORDER — SODIUM CHLORIDE 0.9 % IJ SOLN
3.0000 mL | Freq: Two times a day (BID) | INTRAMUSCULAR | Status: DC
  Administered 2015-10-26: 3 mL via INTRAVENOUS

## 2015-10-24 MED ORDER — SODIUM CHLORIDE 0.9 % IV SOLN: 250.0000 mL | INTRAVENOUS | Status: DC | PRN

## 2015-10-24 MED ORDER — ONDANSETRON HCL 4 MG PO TABS: 4.0000 mg | ORAL_TABLET | Freq: Four times a day (QID) | ORAL | Status: DC | PRN

## 2015-10-24 MED ORDER — HEPARIN (PORCINE) IN NACL 100-0.45 UNIT/ML-% IJ SOLN
1300.0000 [IU]/h | INTRAMUSCULAR | Status: DC
  Administered 2015-10-24 – 2015-10-25 (×2): 1300 [IU]/h via INTRAVENOUS
  Filled 2015-10-24 (×2): qty 250

## 2015-10-24 MED ORDER — IOHEXOL 350 MG/ML SOLN
100.0000 mL | Freq: Once | INTRAVENOUS | Status: AC | PRN
  Administered 2015-10-24: 100 mL via INTRAVENOUS

## 2015-10-24 MED ORDER — MORPHINE SULFATE (PF) 2 MG/ML IV SOLN
2.0000 mg | INTRAVENOUS | Status: DC | PRN
  Administered 2015-10-24 – 2015-10-27 (×16): 2 mg via INTRAVENOUS
  Filled 2015-10-24 (×16): qty 1

## 2015-10-24 NOTE — ED Provider Notes (Signed)
CSN: [id number]     Arrival date & time 10/01/2015  1429 History   First MD Initiated Contact with Patient 10/14/2015 1455     Chief Complaint  Patient presents with  . Tachycardia  . Cancer  . Pain  . Hernia     (Consider location/radiation/quality/duration/timing/severity/associated sxs/prior Treatment) Patient is a 79 y.o. male presenting with abdominal pain. The history is provided by the patient and a relative.  Abdominal Pain Pain location: left inguinal area. Pain quality: aching   Pain radiation: pain in left mid-back and flank associated. Pain severity:  Moderate Onset quality:  Gradual Timing:  Constant Progression:  Unchanged Chronicity:  Chronic Context: not trauma   Context comment:  Known large left inguinal hernia, recent cholangiocarcinoma Dx with metastatic spread Relieved by:  Nothing Worsened by:  Nothing tried Associated symptoms: fatigue and shortness of breath   Associated symptoms: no chest pain and no cough   Shortness of breath:    Severity:  Moderate   Onset quality:  Gradual   Duration:  2 weeks   Timing:  Intermittent   Progression:  Waxing and waning Risk factors: being elderly     Past Medical History  Diagnosis Date  . Persistent atrial fibrillation ([place])   . Ascending aortic aneurysm ([place])   . MVP (mitral valve prolapse)   . Mild aortic insufficiency   . Systemic hypertension   . Permanent atrial fibrillation ([place]) 01/24/2015   Past Surgical History  Procedure Laterality Date  . Orif clavicle fracture  1946    left  . Carpal tunnel release  1946    left  . US echocardiography  03/08/2012    severe left atrial dilatation, moderate right atrial dilatation,mild aortic sclerosis,mild AI, mild bileaflet MVP,trace MR,mild aortic root dilatation.   Family History  Problem Relation Age of Onset  . Cancer Mother     breast  . Heart attack Father   . Heart failure Brother   . Hypertension Sister    Social History  Substance Use Topics   . Smoking status: Former Smoker    Quit date: 12/26/1966  . Smokeless tobacco: None  . Alcohol Use: 0.0 oz/week    0 Standard drinks or equivalent per week     Comment: 2 glasses wine daily    Review of Systems  Constitutional: Positive for fatigue.  Respiratory: Positive for shortness of breath. Negative for cough.   Cardiovascular: Negative for chest pain.  Gastrointestinal: Positive for abdominal pain.  All other systems reviewed and are negative.     Allergies  Review of patient's allergies indicates no known allergies.  Home Medications   Prior to Admission medications   Medication Sig Start Date End Date Taking? Authorizing Provider  ELIQUIS 5 MG TABS tablet Take 5 mg by mouth 2 (two) times daily.  01/18/15  Yes Historical Provider, MD  metoprolol succinate (TOPROL-XL) 50 MG 24 hr tablet Take 50 mg by mouth daily. Take with or immediately following a meal.   Yes Historical Provider, MD  Cholecalciferol (VITAMIN D) 2000 UNITS tablet Take 2,000 Units by mouth daily.    Historical Provider, MD  ciprofloxacin (CIPRO) 500 MG tablet Take 1 tablet (500 mg total) by mouth 2 (two) times daily. 10/07/2015   Milus Banister, MD  folic acid (FOLVITE) 643 MCG tablet Take 400 mcg by mouth 2 (two) times daily.    Historical Provider, MD   BP 129/94 mmHg  Pulse 120  Temp(Src) 97.7 F (36.5 C) (Oral)  Resp  20  SpO2 96% Physical Exam  Constitutional: He is oriented to person, place, and time. He appears well-developed and well-nourished. He appears ill. No distress.  HENT:  Head: Normocephalic and atraumatic.  Eyes: Conjunctivae are normal.  Neck: Neck supple. No tracheal deviation present.  Cardiovascular: S1 normal and S2 normal.  An irregularly irregular rhythm present. Tachycardia present.  Exam reveals no S3.   Pulmonary/Chest: Effort normal and breath sounds normal. No tachypnea. No respiratory distress.  Abdominal: Soft. Normal appearance. He exhibits no distension. There is no  tenderness (upper abdominal discomfort). There is no rebound, no guarding and no CVA tenderness. A hernia is present. Hernia confirmed positive in the left inguinal area (large, mobile into defect, non-tender).  Tenderness over left flank and posterior left ribcage  Neurological: He is alert and oriented to person, place, and time. He has normal strength. GCS eye subscore is 4. GCS verbal subscore is 5. GCS motor subscore is 6.  Skin: Skin is warm and dry.  Psychiatric: He has a normal mood and affect.    ED Course  Procedures (including critical care time)  CRITICAL CARE Performed by: Leo Grosser Total critical care time: 30 minutes Critical care time was exclusive of separately billable procedures and treating other patients. Critical care was necessary to treat or prevent imminent or life-threatening deterioration. Critical care was time spent personally by me on the following activities: development of treatment plan with patient and/or surrogate as well as nursing, discussions with consultants, evaluation of patient's response to treatment, examination of patient, obtaining history from patient or surrogate, ordering and performing treatments and interventions, ordering and review of laboratory studies, ordering and review of radiographic studies, pulse oximetry and re-evaluation of patient's condition.   Emergency Focused Ultrasound Exam Limited Ultrasound of the Heart and Pericardium  Performed and interpreted by Dr. Laneta Simmers Indication: shortness of breath Multiple views of the heart, pericardium, and IVC are obtained with a multi frequency probe.  Findings: moderately reduced contractility, enlarged RV without bowing of IV septum, no anechoic fluid, minimal IVC collapse Interpretation: moderately reduced ejection fraction, no pericardial effusion, no depressed CVP, possible RV strain Images archived electronically.  CPT Code: 32355   Labs Review Labs Reviewed  CBC WITH  DIFFERENTIAL/PLATELET - Abnormal; Notable for the following:    WBC 12.8 (*)    RBC 3.56 (*)    Hemoglobin 9.8 (*)    HCT 29.5 (*)    Platelets 54 (*)    Neutro Abs 10.8 (*)    Monocytes Absolute 1.2 (*)    All other components within normal limits  COMPREHENSIVE METABOLIC PANEL - Abnormal; Notable for the following:    Sodium 133 (*)    CO2 21 (*)    Glucose, Bld 115 (*)    BUN 35 (*)    Calcium 8.7 (*)    Total Protein 6.2 (*)    Albumin 3.0 (*)    AST 47 (*)    Alkaline Phosphatase 440 (*)    Total Bilirubin 1.4 (*)    All other components within normal limits  BRAIN NATRIURETIC PEPTIDE - Abnormal; Notable for the following:    B Natriuretic Peptide 490.6 (*)    All other components within normal limits  URINALYSIS, ROUTINE W REFLEX MICROSCOPIC (NOT AT [facility]) - Abnormal; Notable for the following:    Color, Urine AMBER (*)    APPearance CLOUDY (*)    Hgb urine dipstick SMALL (*)    Bilirubin Urine SMALL (*)  Protein, ur 100 (*)    All other components within normal limits  PROTIME-INR - Abnormal; Notable for the following:    Prothrombin Time 25.2 (*)    INR 2.32 (*)    All other components within normal limits  APTT - Abnormal; Notable for the following:    aPTT 44 (*)    All other components within normal limits  URINE MICROSCOPIC-ADD ON - Abnormal; Notable for the following:    Bacteria, UA MANY (*)    Casts HYALINE CASTS (*)    All other components within normal limits  I-STAT TROPOININ, ED - Abnormal; Notable for the following:    Troponin i, poc 0.16 (*)    All other components within normal limits  HEPARIN LEVEL (UNFRACTIONATED)  I-STAT CG4 LACTIC ACID, ED    Imaging Review Ct Angio Chest Pe W/cm &/or Wo Cm  10/22/2015  CLINICAL DATA:  Shortness of breath, tachycardia, atrial fibrillation, former smoker, history cholangiocarcinoma EXAM: CT ANGIOGRAPHY CHEST WITH CONTRAST TECHNIQUE: Multidetector CT imaging of the chest was performed using the standard  protocol during bolus administration of intravenous contrast. Multiplanar CT image reconstructions and MIPs were obtained to evaluate the vascular anatomy. CONTRAST:  120mL OMNIPAQUE IOHEXOL 350 MG/ML SOLN IV COMPARISON:  02/26/2015 FINDINGS: Aneurysmal dilatation ascending thoracic aorta 4.5 cm transverse image 41 previously 4.3 cm. Scattered atherosclerotic calcifications aorta and coronary arteries. Enlargement of cardiac chambers particularly atria. Numerous small BILATERAL pulmonary arterial filling defects throughout all lobes consistent with pulmonary emboli, including small saddle emboli at the RIGHT upper and RIGHT lower lobes an within LEFT lower lobe branches. Elevated RV/LV ratio = 1.25 consistent with RIGHT heart strain. Enhancing nodule within the mid thoracic esophagus on the previous exam no longer identified though the mid the esophagus does demonstrate mild wall thickening. No thoracic adenopathy. Visualized upper abdomen significant for mild ascites. Small BILATERAL pleural effusions RIGHT greater than LEFT. Atelectasis RIGHT lower lobe. Numerous new nodular foci in both lungs as well as a focus of questionable nodularity versus infiltrate in the anterior RIGHT upper lobe 13 mm diameter image 43. Segmental bronchiectasis anterior basal RIGHT lower lobe. No infiltrate or pneumothorax. No acute osseous findings. Review of the MIP images confirms the above findings. IMPRESSION: Multiple BILATERAL pulmonary emboli. Positive for acute PE with CT evidence of right heart strain (RV/LV Ratio = 1.25) consistent with at least submassive (intermediate risk) PE. The presence of right heart strain has been associated with an increased risk of morbidity and mortality. Please activate Code PE by paging [phone]. Enlargement of cardiac chambers particularly both atria. Aneurysmal dilatation ascending thoracic aorta, recommendation below. Ascending thoracic aortic aneurysm. Recommend semi-annual imaging  followup by CTA or MRA and referral to cardiothoracic surgery if not already obtained. This recommendation follows 2010 ACCF/AHA/AATS/ACR/ASA/SCA/SCAI/SIR/STS/SVM Guidelines for the Diagnosis and Management of Patients With Thoracic Aortic Disease. Circulation. 2010; 121: I627-O350 New multiple BILATERAL pulmonary nodules suspicious for metastatic disease in this patient with a history of cholangiocarcinoma. Small BILATERAL pleural effusions with upper abdominal ascites noted. Critical Value/emergent results were called by telephone at the time of interpretation on 09/27/2015 at 1644 hours to Dr. Leo Grosser , who verbally acknowledged these results. Electronically Signed   By: Lavonia Dana M.D.   On: 09/28/2015 16:44   I have personally reviewed and evaluated these images and lab results as part of my medical decision-making.   EKG Interpretation   Date/Time:  Friday October 24 2015 14:50:53 EDT Ventricular Rate:  119 PR Interval:  QRS Duration: 94 QT Interval:  316 QTC Calculation: 445 R Axis:   -42 Text Interpretation:  Atrial fibrillation Multiple ventricular premature  complexes Left anterior fascicular block Borderline T abnormalities,  diffuse leads SINCE LAST TRACING HEART RATE HAS INCREASED now afib  Confirmed by Darrin Apodaca MD, Quillian Quince ([id number]) on 10/20/2015 3:00:40 PM      MDM   Final diagnoses:  Other acute pulmonary embolism with acute cor pulmonale ([place])    79 year old male with history of atrial fibrillation on her for any quite duration and rate control with metoprolol presents with sustained tachycardia resistant to therapy, ongoing pain from large chronic left inguinal hernia, ongoing shortness of breath that he relates to pain and worsening pain in the left lower posterior rib cage and back. He is ill-appearing on arrival, is moderate risk for PE by well's criteria despite active anti-coagulation, with active oncologic process d-dimer likely to be elevated so unable to further  risk stratify without CT evaluation. He is pending MRCP for further evaluation of his cholangiocarcinoma but was unable to tolerate due to pain according to his son who is an internal medicine physician within our system.  Troponin is elevated further suggesting either rate related ischemia or obstructive etiology, CT ordered for rule out of pulmonary embolism with T-wave inversions in inferior distribution on arrival EKG concerning for potential right heart strain. Bedside echo performed that demonstrates moderately reduced ejection fraction and lack of respiratory variation with IVC suggestive of possible rate related decompensation or strain. RV is enlarged. CT with multiple bilateral PEs in both lungs with further evidence of right heart strain suggesting moderate risk. Anticoagulated on heparin. Hospitalist was consulted for admission to stepdown and will see the patient in the emergency department. Discussed possible "code PE" initiation with hospitalist, will defer to inpatient team.     Leo Grosser, MD 10/23/2015 1721

## 2015-10-24 NOTE — Progress Notes (Addendum)
ANTICOAGULATION CONSULT NOTE - Initial Consult  Pharmacy Consult for IV heparin - transition from apixaban PTA Indication: DVT  Allergies  Allergen Reactions  . Pradaxa [Dabigatran Etexilate Mesylate]     Caused intolerable reflux sxs.   Alveda Reasons [Rivaroxaban]     Caused bleeding of the gums.     Patient Measurements: Height: 5\' 10"  (177.8 cm) Weight: 181 lb 12.8 oz (82.464 kg) IBW/kg (Calculated) : 73 Heparin Dosing Weight: 82.5 kg  Vital Signs: Temp: 97.6 F (36.4 C) (10/28 1716) Temp Source: Oral (10/28 1716) BP: 130/69 mmHg (10/28 1716) Pulse Rate: 109 (10/28 1716)  Labs:  Recent Labs  10/23/15 1225 10/10/2015 1454 09/27/2015 1644  HGB 10.7* 9.8*  --   HCT 32.2* 29.5*  --   PLT 51.0 Repeated and verified X2.* 54*  --   APTT  --   --  44*  LABPROT 23.8*  --  25.2*  INR 2.2*  --  2.32*  CREATININE 0.75 0.72  --     Estimated Creatinine Clearance: 79.8 mL/min (by C-G formula based on Cr of 0.72).   Medical History: Past Medical History  Diagnosis Date  . Persistent atrial fibrillation ([place])   . Ascending aortic aneurysm ([place])   . MVP (mitral valve prolapse)   . Mild aortic insufficiency   . Systemic hypertension   . Permanent atrial fibrillation ([place]) 01/24/2015  . Inguinal hernia 1980s.     Medications:  Scheduled:   Infusions:    Assessment: 79 yo presented to ER with tachycardia, pain, and possible metastatic cholangiocarcinoma. Patient on apixaban 5mg  BID PTA for hx Afib now to transition to IV heparin for new PE. Patient's last dose of apixaban was this AM 10/28 at 0600. Baseline aPTT a little elevated, waiting on heparin level results to see which lab we will use to monitor and adjust heparin for now until those labs correlate. Baseline plt level low at 54k and will need to monitor closely for bleeding.   Goal of Therapy:  Heparin level 0.3-0.7 units/ml aPTT 66-102 seconds Monitor platelets by anticoagulation protocol: Yes   Plan:  1) No IV  heparin bolus 2) Start IV heparin at 1900 (7pm) at rate of 1300 units/hr 3) Check heparin level 8 hours after start of heparin infusion (0300 on 10/29) 4) Monitor for bleeding closely with low baseline plt level   Adrian Saran, PharmD, BCPS Pager [phone] 10/03/2015 6:01 PM

## 2015-10-24 NOTE — ED Notes (Signed)
This is a patient of Dr. Ardis Hughs office being seen with cancer with multiple areas of metastasis. His son is here with him who is a doctor working with Dr. Ardis Hughs. Pt has a massive hernia in/near scrotum that's been causing him a lot of pain, as well as left rib cage pain. Presents tachycardic at 130, pale, and SOB.

## 2015-10-24 NOTE — ED Notes (Signed)
Results from Istat troponin reported to edp

## 2015-10-24 NOTE — H&P (Signed)
[facility] History and Physical  Brandon Dean HWE:[id number] DOB: [birth year] DOA: 10/01/2015  Referring physician:  PCP: Tivis Ringer, MD   Chief Complaint: Hernia Pain  HPI: Brandon Dean is a 79 y.o. male is a pleasant 79 year old gentleman having a past medical history of chronic atrial fibrillation on chronic anticoagulation with Eliquis, aortic aneurysm, hypertension, scrotal hernia who at baseline is highly functional and independent with all instrumental activities of daily living. He had a CT scan of abdomen and pelvis with IV contrast performed on 02/26/2015 which revealed 4 cm ascending thoracic aortic aneurysm along with an abnormal enhancement of the tip of the lateral segment of left liver. Brandon Dean was set up with follow-up imaging however this was postponed for a trip to [place]. It appears that an MRI of abdomen was attempted several weeks ago however patient was unable to complete exam due to significant back pain. On 10/21/2015 he had a CT scan of abdomen and pelvis that revealed abnormal enhancement along the walls of the common bile duct highly suspicious for cholangiocarcinoma. He was referred to Dr.Jacobs of GI who ordered further labs including CA 19-9 that came back elevated at 690. He was scheduled to undergo MRCP for further workup. He was sent from Dr. Ardis Hughs office to the emergency room to undergo further evaluation. He had a CT scan of abdomen and pelvis with IV contrast of lungs that revealed multiple bilateral pulmonary emboli. Furthermore this study revealed multiple bilateral pulmonary nodules that are suspicious for metastatic disease. Brandon Dean's biggest concern is discomfort from scrotal hernia. He complains of pain and shortness of breath associated with this. He also complains of 10 pound weight loss over the past month. He denies fevers, chills, night sweats, chest pain, palpitations, recent falls.                                                               Review of Systems:  Constitutional:  No weight loss, night sweats, Fevers, chills, fatigue.  HEENT:  No headaches, Difficulty swallowing,Tooth/dental problems,Sore throat,  No sneezing, itching, ear ache, nasal congestion, post nasal drip,  Cardio-vascular:  No chest pain, Orthopnea, PND, swelling in lower extremities, anasarca, dizziness, palpitations  GI:  Positive for scrotal hernia, No heartburn, indigestion, abdominal pain, nausea, vomiting, diarrhea, change in bowel habits, loss of appetite  Resp:  No shortness of breath with exertion or at rest. No excess mucus, no productive cough, No non-productive cough, No coughing up of blood.No change in color of mucus.No wheezing.No chest wall deformity  Skin:  no rash or lesions.  GU:  no dysuria, change in color of urine, no urgency or frequency. No flank pain. Positive for scrotal hernia Musculoskeletal:  No joint pain or swelling. No decreased range of motion. Positive for back pain.  Psych:  No change in mood or affect. No depression or anxiety. No memory loss.   Past Medical History  Diagnosis Date  . Persistent atrial fibrillation ([place])   . Ascending aortic aneurysm ([place])   . MVP (mitral valve prolapse)   . Mild aortic insufficiency   . Systemic hypertension   . Permanent atrial fibrillation ([place]) 01/24/2015  . Inguinal hernia 1980s.    Past Surgical History  Procedure Laterality Date  . Orif clavicle fracture  1946  left  . Carpal tunnel release  2010    left  . US echocardiography  03/08/2012    severe left atrial dilatation, moderate right atrial dilatation,mild aortic sclerosis,mild AI, mild bileaflet MVP,trace Brandon,mild aortic root dilatation.  . Right finger mass Right 2011    removed, benign.    Social History:  reports that he quit smoking about 48 years ago. He does not have any smokeless tobacco history on file. He reports that he drinks alcohol. He reports that he does not use illicit drugs.  Allergies    Allergen Reactions  . Pradaxa [Dabigatran Etexilate Mesylate]     Caused intolerable reflux sxs.   Alveda Reasons [Rivaroxaban]     Caused bleeding of the gums.     Family History  Problem Relation Age of Onset  . Cancer Mother     breast  . Heart attack Father   . Heart failure Brother   . Hypertension Sister      Prior to Admission medications   Medication Sig Start Date End Date Taking? Authorizing Provider  atorvastatin (LIPITOR) 10 MG tablet Take 1 tablet by mouth daily. 10/11/15  Yes Historical Provider, MD  Cholecalciferol (VITAMIN D) 2000 UNITS tablet Take 4,000 Units by mouth daily.    Yes Historical Provider, MD  ELIQUIS 5 MG TABS tablet Take 5 mg by mouth 2 (two) times daily.  01/18/15  Yes Historical Provider, MD  folic acid (FOLVITE) 993 MCG tablet Take 400 mcg by mouth daily.    Yes Historical Provider, MD  metoprolol succinate (TOPROL-XL) 50 MG 24 hr tablet Take 50 mg by mouth daily. Take with or immediately following a meal.   Yes Historical Provider, MD  Omega-3 Fatty Acids (FISH OIL PO) Take 1 tablet by mouth daily.   Yes Historical Provider, MD  traMADol (ULTRAM) 50 MG tablet Take 1-2 tablets by mouth every 6 (six) hours as needed. Pain. 10/01/15  Yes Historical Provider, MD  ciprofloxacin (CIPRO) 500 MG tablet Take 1 tablet (500 mg total) by mouth 2 (two) times daily. 10/23/2015   Milus Banister, MD   Physical Exam: Filed Vitals:   10/22/2015 1640 10/20/2015 1708 10/03/2015 1713 10/18/2015 1716  BP:   132/77 130/69  Pulse: 110  112 109  Temp:    97.6 F (36.4 C)  TempSrc:    Oral  Resp: 23  20 14   Height:  5\' 10"  (1.778 m)    Weight:  82.464 kg (181 lb 12.8 oz)    SpO2: 95%  98% 97%    Wt Readings from Last 3 Encounters:  10/06/2015 82.464 kg (181 lb 12.8 oz)  01/23/15 88.451 kg (195 lb)    General:  Chronically ill-appearing, although no acute distress. He is awake alert pleasant. Eyes: PERRL, normal lids, irises & conjunctiva, has scleral icterus ENT: grossly  normal hearing, lips & tongue Neck: no LAD, masses or thyromegaly Cardiovascular: Irregular rate and rhythm no m/r/g. Trace edema to lower extremities bilaterally Telemetry: A. fib  Respiratory: CTA bilaterally, no w/r/r. Normal respiratory effort. Abdomen: soft, ntnd Genitourinary: There is a massive left sided scrotal hernia that extends down to just above the knee. On palpation this appeared to be reducible, there was no pain associated with palpation.  Skin: no rash or induration seen on limited exam Musculoskeletal: grossly normal tone BUE/BLE, trace edema to lower extremities bilaterally Psychiatric: grossly normal mood and affect, speech fluent and appropriate Neurologic: grossly non-focal.          Labs  on Admission:  Basic Metabolic Panel:  Recent Labs Lab 10/23/15 1225 10/08/2015 1454  NA 133* 133*  K 4.2 4.1  CL 101 103  CO2 23 21*  GLUCOSE 118* 115*  BUN 34* 35*  CREATININE 0.75 0.72  CALCIUM 8.8 8.7*   Liver Function Tests:  Recent Labs Lab 10/23/15 1225 10/21/2015 1454  AST 38* 47*  ALT 28 30  ALKPHOS 420* 440*  BILITOT 1.5* 1.4*  PROT 6.3 6.2*  ALBUMIN 3.1* 3.0*   No results for input(s): LIPASE, AMYLASE in the last 168 hours. No results for input(s): AMMONIA in the last 168 hours. CBC:  Recent Labs Lab 10/23/15 1225 10/06/2015 1454  WBC 13.3* 12.8*  NEUTROABS 11.2* 10.8*  HGB 10.7* 9.8*  HCT 32.2* 29.5*  MCV 82.1 82.9  PLT 51.0 Repeated and verified X2.* 54*   Cardiac Enzymes: No results for input(s): CKTOTAL, CKMB, CKMBINDEX, TROPONINI in the last 168 hours.  BNP (last 3 results)  Recent Labs  09/27/2015 1530  BNP 490.6*    ProBNP (last 3 results) No results for input(s): PROBNP in the last 8760 hours.  CBG: No results for input(s): GLUCAP in the last 168 hours.  Radiological Exams on Admission: Ct Angio Chest Pe W/cm &/or Wo Cm  10/17/2015  CLINICAL DATA:  Shortness of breath, tachycardia, atrial fibrillation, former smoker,  history cholangiocarcinoma EXAM: CT ANGIOGRAPHY CHEST WITH CONTRAST TECHNIQUE: Multidetector CT imaging of the chest was performed using the standard protocol during bolus administration of intravenous contrast. Multiplanar CT image reconstructions and MIPs were obtained to evaluate the vascular anatomy. CONTRAST:  135mL OMNIPAQUE IOHEXOL 350 MG/ML SOLN IV COMPARISON:  02/26/2015 FINDINGS: Aneurysmal dilatation ascending thoracic aorta 4.5 cm transverse image 41 previously 4.3 cm. Scattered atherosclerotic calcifications aorta and coronary arteries. Enlargement of cardiac chambers particularly atria. Numerous small BILATERAL pulmonary arterial filling defects throughout all lobes consistent with pulmonary emboli, including small saddle emboli at the RIGHT upper and RIGHT lower lobes an within LEFT lower lobe branches. Elevated RV/LV ratio = 1.25 consistent with RIGHT heart strain. Enhancing nodule within the mid thoracic esophagus on the previous exam no longer identified though the mid the esophagus does demonstrate mild wall thickening. No thoracic adenopathy. Visualized upper abdomen significant for mild ascites. Small BILATERAL pleural effusions RIGHT greater than LEFT. Atelectasis RIGHT lower lobe. Numerous new nodular foci in both lungs as well as a focus of questionable nodularity versus infiltrate in the anterior RIGHT upper lobe 13 mm diameter image 43. Segmental bronchiectasis anterior basal RIGHT lower lobe. No infiltrate or pneumothorax. No acute osseous findings. Review of the MIP images confirms the above findings. IMPRESSION: Multiple BILATERAL pulmonary emboli. Positive for acute PE with CT evidence of right heart strain (RV/LV Ratio = 1.25) consistent with at least submassive (intermediate risk) PE. The presence of right heart strain has been associated with an increased risk of morbidity and mortality. Please activate Code PE by paging [phone]. Enlargement of cardiac chambers particularly both  atria. Aneurysmal dilatation ascending thoracic aorta, recommendation below. Ascending thoracic aortic aneurysm. Recommend semi-annual imaging followup by CTA or MRA and referral to cardiothoracic surgery if not already obtained. This recommendation follows 2010 ACCF/AHA/AATS/ACR/ASA/SCA/SCAI/SIR/STS/SVM Guidelines for the Diagnosis and Management of Patients With Thoracic Aortic Disease. Circulation. 2010; 121: S505-L976 New multiple BILATERAL pulmonary nodules suspicious for metastatic disease in this patient with a history of cholangiocarcinoma. Small BILATERAL pleural effusions with upper abdominal ascites noted. Critical Value/emergent results were called by telephone at the time of interpretation on 10/11/2015 at  1644 hours to Dr. Leo Grosser , who verbally acknowledged these results. Electronically Signed   By: Lavonia Dana M.D.   On: 10/04/2015 16:44    EKG: Independently reviewed. A. fib  Assessment/Plan Principal Problem:   Pulmonary embolism (HCC) Active Problems:   Cholangiocarcinoma (HCC)   HTN (hypertension)   Dyslipidemia   1. Pulmonary embolism. Brandon Dean is a pleasant 79 year old gentleman a past medical history of atrial fibrillation who had been on Eliquis therapy, having findings concerning for cholangiocarcinoma on recent scans, found to have acute bilateral pulmonary embolism on CT scan with IV contrast. Patient in a hypercoagulable state from probable underlying malignancy. Will place him on IV heparin overnight and thereafter transition to him subcutaneous Lovenox. He is presently hemodynamically stable, having last blood pressure 127/83. He is awake, alert, mentating well. Will monitor closely in the step down unit with continuous cardiac monitoring. 2. 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 Brandon Dean had a CT scan of abdomen and pelvis with IV contrast performed on 10/21/2015 that showed abnormal enhancement along the walls of the distended common bile duct that is highly suspicious  for cholangiocarcinoma. There were numerous small hypodense lesions throughout the periphery of the liver suspicious for metastatic disease. Additionally lung scan revealed presence of multiple bilateral nodules that are highly suspicious for pulmonary metastasis. Case was discussed with GI, plan to further workup with MRCP in a.m. 3. Scrotal hernia. Patient having a history of scrotal hernia which he states has been present since birth. This has become significant in size and had seen a surgeon in the past about repairing it. On examination this hernia did not appear to be incarcerated. He has acute pulmonary embolism and would be a high surgical risk at this time. Will offer IV narcotic analgesics for pain symptoms. This can be reassessed once acute pulmonary issues resolved. 4. Atrial fibrillation CHADS VAsc score of 3. He was previously taking Eliquis for CVA prophylaxis. Now presents with acute bilateral pulmonary embolism. He does not have a history of CVA. Given probable malignancy will need to change anticoagulation to Lovenox subcutaneous. Will continue beta blocker therapy with metoprolol 50 mg by mouth daily. Will monitor blood pressures closely. 5.  Dyslipidemia. Will discontinue his statin therapy given the possibility of metastatic cholangiocarcinoma.  6. Thrombocytopenia. Labs showing a platelet count of 54,000, suspect secondary to liver disease resulting from metastatic cholangiocarcinoma. 7. Coagulopathy. Labs showing INR of 2.32, likely secondary to liver disease in setting of metastatic cholangiocarcinoma. 8. DVT prophylaxis. Patient is fully anticoagulated   Code Status: CODE STATUS discussed with patient he is a full code Family Communication: I spoke with his son Dr Crist Infante Disposition Plan: Will admit to the inpatient service, anticipate he'll require greater than 2 nights hospitalization  Time spent: 70 min  Kelvin Cellar [facility] Pager [phone]

## 2015-10-24 NOTE — Telephone Encounter (Signed)
callin in cipro

## 2015-10-24 NOTE — Consult Note (Signed)
[facility] Consult: 4:47 PM 10/21/2015     Referring Provider: Dr Laneta Simmers in ED  Primary Care Physician:  Tivis Ringer, MD Primary Gastroenterologist:  Dr. Ardis Hughs    Reason for Consultation:  ? Cholangiocarcinoma.    HPI: Brandon Dean is a 79 y.o. male.  Pt is father of Dr Crist Infante.  On Eliquis for permanent a fib.  Pt is stoic and tends to avoid extensive medical investigations.   Ct angio chest/abdomen 02/26/15: fatty liver, abnormal enhancement left liver, focally thickened GB wall, 4.4 ascending AAA, hyperenhancing region in mid esophagus, 16 mm left kidney lesion,  MRI recommended to eval liver.  EGD rec for eval of esophagus.  Pt opted to travel to [place] and had no further imaging.    Seen in office for initial consult by Dr Ardis Hughs yesterday.  Decades long hx of left inguinal hernia, which pt is able to reduce manually.  Previously painful very rarely but in last 2 to 3 months, pain persistent and more intense as the hernia has descended deeper into the left scrotum.  Also having pain in left lateral ribs, more posterior than anterior.  Tylenol 1000 mg BID helps, Tramadol did not help. Appetite diminished but not n/v.  ~ 10 # weight loss.  Still has daily stools. Dr Dalbert Batman agreeable to repair hernia after office visit recently, however new findings on imaging studies likely to change his opinion.  Pt has not been drinking his usual 1 to 2 glasses wine nightly for several weeks, trying to avoid liver toxicity as he is taking the Tylenol.   CT abdomen and pelvis 10/25: abnml distal CBD, concerning for cancer.  Several liver and pulmonary lesions, suspicious for mets. Large left inguinal hernia extending into scrotum containing SB loops. Renal cysts non worrisome. Adenopathy in RP, upper abdomen again  concerning for mets. MRI/MRCP attempted after CT but pt too uncomfortable laying on table so unable to complete study.  Dr Ardis Hughs planned MR studies for 10/31, with pt advised to premedicate with valium to allow for successful study.  However labs from yesterday showed worsening LFTs and pt more uncomfortable so agreed with MD rec to go to ED today.  ED MD ordered CT angio of chest to r/o PE.    Found to be in rapid a fib. Rate noted ~ 100 on cards office visit in 12/2014.   LFTs today with alk phos in 400s, was ~ 180 three weeks ago.  AST/ALT 47/30.  t bili 1.5.  Hgb 9.8, was >12 3 weeks ago.  MCV normal. WBCs 13.3.  Platelets 51, were normal ~ 10/3. Troponin is 0.16. Coags abnormal: 23.8 and 2.2.  CA 19-9 is 690.       Past Medical History  Diagnosis Date  . Persistent atrial fibrillation ([place])   . Ascending aortic aneurysm ([place])   . MVP (mitral valve prolapse)   . Mild aortic insufficiency   . Systemic hypertension   . Permanent atrial fibrillation ([place]) 01/24/2015  . Inguinal hernia 1980s.     Past Surgical History  Procedure Laterality Date  . Orif clavicle fracture  1946    left  . Carpal tunnel release  1946    left  . US echocardiography  03/08/2012    severe left atrial dilatation, moderate right atrial dilatation,mild aortic sclerosis,mild AI, mild bileaflet MVP,trace MR,mild aortic root dilatation.    Prior to Admission medications   Medication Sig Start Date End Date Taking? Authorizing Provider  ELIQUIS 5 MG TABS tablet Take 5 mg by mouth 2 (two) times daily.  01/18/15  Yes Historical Provider, MD  metoprolol succinate (TOPROL-XL) 50 MG 24 hr tablet Take 50 mg by mouth daily. Take with or immediately following a meal.   Yes Historical Provider, MD  Cholecalciferol (VITAMIN D) 2000 UNITS tablet Take 2,000 Units by mouth daily.    Historical Provider, MD  ciprofloxacin (CIPRO) 500 MG tablet Take 1 tablet (500 mg total) by mouth 2 (two) times daily. 10/09/2015   Milus Banister, MD  folic acid (FOLVITE) 161 MCG tablet Take 400 mcg by mouth 2 (two) times daily.    Historical Provider, MD    Scheduled Meds:   Infusions:   PRN Meds: fentaNYL (SUBLIMAZE) injection, ondansetron (ZOFRAN) IV   Allergies as of 10/09/2015  . (No Known Allergies)    Family History  Problem Relation Age of Onset  . Cancer Mother     breast  . Heart attack Father   . Heart failure Brother   . Hypertension Sister     Social History   Social History  . Marital Status: Married    Spouse Name: N/A  . Number of Children: N/A  . Years of Education: N/A   Occupational History  . Not on file.   Social History Main Topics  . Smoking status: Former Smoker    Quit date: 12/26/1966  . Smokeless tobacco: Not on file  . Alcohol Use: 0.0 oz/week    0 Standard drinks or equivalent per week     Comment: 2 glasses wine daily  . Drug Use: No  . Sexual Activity: Not on file   Other Topics Concern  . Not on file   Social History Narrative    REVIEW OF SYSTEMS: Constitutional:  Increase fatigue, no falls ENT:  No nose bleeds Pulm:  No cough.  Some DOE. CV:  No palpitations, no LE edema.  GU:  No hematuria, no frequency GI:  Per HPI Heme:  No previous issues with anemia or current issues with bleeding, bruising.    Transfusions:  none Neuro:  No headaches, no peripheral tingling or numbness Derm:  No itching, no rash or sores.  Endocrine:  No sweats or chills.  No polyuria or dysuria Immunization:  Not queried.  Travel:  None beyond local counties in last few months.    PHYSICAL EXAM: Vital signs in last 24 hours: Filed Vitals:   10/13/2015 1612  BP: 143/106  Pulse:   Temp:   Resp: 25   Wt Readings from Last 3 Encounters:  01/23/15 195 lb (88.451 kg)    General: pleasant, somewhat tired/weak but overall looks well.  Head:  No asymmetry or swelling  Eyes:  No icterus or conj pallor Ears:  Not HOH  Nose:  No congestion or discharge Mouth:  Clear and  moist Neck:  No mass, no TMG. No JVD Lungs:  Clear bil.  No cough or dyspnea Heart: tachy, irreg, rate as high as 140s.  Abdomen:  Soft, ND.  Firm without obvious masses, large somewhat uncomfortable  left inguinal hernia palpable and visible into the left scrotum. .   Rectal: not performed   Musc/Skeltl: scoliosis Extremities:  Slight 1+ edema in ankles/feet, a bit worse on left  Neurologic:  oriented x 3.  Moves all 4 limbs, strength not tested.  No tremor.   Skin:  A bit sallow but not clearly jaundiced Tattoos:  none Nodes:  No inguinal or cervical adenopathy   Psych:  Pleasant, calm, cooperative       LAB RESULTS:  Recent Labs  10/23/15 1225 09/28/2015 1454  WBC 13.3* 12.8*  HGB 10.7* 9.8*  HCT 32.2* 29.5*  PLT 51.0 Repeated and verified X2.* 54*   BMET Lab Results  Component Value Date   NA 133* 10/22/2015   NA 133* 10/23/2015   NA 139 07/14/2009   K 4.1 10/17/2015   K 4.2 10/23/2015   K 3.7 07/14/2009   CL 103 10/19/2015   CL 101 10/23/2015   CL 106 07/14/2009   CO2 21* 09/29/2015   CO2 23 10/23/2015   CO2 27 07/14/2009   GLUCOSE 115* 10/10/2015   GLUCOSE 118* 10/23/2015   GLUCOSE 128* 07/14/2009   BUN 35* 10/03/2015   BUN 34* 10/23/2015   BUN 32* 07/14/2009   CREATININE 0.72 10/11/2015   CREATININE 0.75 10/23/2015   CREATININE 0.85 07/14/2009   CALCIUM 8.7* 10/17/2015   CALCIUM 8.8 10/23/2015   CALCIUM 9.2 07/14/2009   LFT  Recent Labs  10/23/15 1225 10/20/2015 1454  PROT 6.3 6.2*  ALBUMIN 3.1* 3.0*  AST 38* 47*  ALT 28 30  ALKPHOS 420* 440*  BILITOT 1.5* 1.4*   PT/INR Lab Results  Component Value Date   INR 2.2* 10/23/2015     RADIOLOGY STUDIES: Ct Angio Chest Pe W/cm &/or Wo Cm  10/12/2015  CLINICAL DATA:  Shortness of breath, tachycardia, atrial fibrillation, former smoker, history cholangiocarcinoma EXAM: CT ANGIOGRAPHY CHEST WITH CONTRAST TECHNIQUE: Multidetector CT imaging of the chest was performed using the standard protocol  during bolus administration of intravenous contrast. Multiplanar CT image reconstructions and MIPs were obtained to evaluate the vascular anatomy. CONTRAST:  170m OMNIPAQUE IOHEXOL 350 MG/ML SOLN IV COMPARISON:  02/26/2015 FINDINGS: Aneurysmal dilatation ascending thoracic aorta 4.5 cm transverse image 41 previously 4.3 cm. Scattered atherosclerotic calcifications aorta and coronary arteries. Enlargement of cardiac chambers particularly atria. Numerous small BILATERAL pulmonary arterial filling defects throughout all lobes consistent with pulmonary emboli, including small saddle emboli at the RIGHT upper and RIGHT lower lobes an within LEFT lower lobe branches. Elevated RV/LV ratio = 1.25 consistent with RIGHT heart strain. Enhancing nodule within the mid thoracic esophagus on the previous exam no longer identified though the mid the esophagus does demonstrate mild wall thickening. No thoracic adenopathy. Visualized upper abdomen significant for mild ascites. Small BILATERAL pleural effusions RIGHT greater than LEFT. Atelectasis RIGHT lower lobe. Numerous new nodular foci in both lungs as well as a focus of questionable nodularity versus infiltrate in the anterior RIGHT upper lobe 13 mm diameter image 43. Segmental bronchiectasis anterior basal RIGHT lower lobe. No infiltrate or pneumothorax. No acute osseous findings. Review of the MIP images confirms the above findings. IMPRESSION: Multiple BILATERAL pulmonary emboli. Positive for acute PE with CT evidence of right heart strain (RV/LV Ratio = 1.25) consistent with at least submassive (intermediate risk) PE. The presence of right heart strain has been associated with an increased risk of morbidity and mortality. Please activate Code PE by paging [phone] Enlargement of cardiac chambers particularly both atria. Aneurysmal dilatation  ascending thoracic aorta, recommendation below. Ascending thoracic aortic aneurysm. Recommend semi-annual imaging followup by CTA  or MRA and referral to cardiothoracic surgery if not already obtained. This recommendation follows 2010 ACCF/AHA/AATS/ACR/ASA/SCA/SCAI/SIR/STS/SVM Guidelines for the Diagnosis and Management of Patients With Thoracic Aortic Disease. Circulation. 2010; 121: K354-S568 New multiple BILATERAL pulmonary nodules suspicious for metastatic disease in this patient with a history of cholangiocarcinoma. Small BILATERAL pleural effusions with upper abdominal ascites noted. Critical Value/emergent results were called by telephone at the time of interpretation on 10/08/2015 at 1644 hours to Dr. Leo Grosser , who verbally acknowledged these results. Electronically Signed   By: Lavonia Dana M.D.   On: 10/17/2015 16:44   Endoscopies: Remote, benign, flex sig.   IMPRESSION:   *  Suspected Liver, pulmonary mets and abnormal CBD, elevated CA 19-9.  All concerning for metastatic cholangiocarcinoma.  *  Left inguinal hernia, painful, not incarceratedor obviously obstructed.   *  Rapid a fib. Long term Eliquis for longstanding a fib  *  Normocytic anemia  *  Thrombocytopenia.   *  4.4 cm ascending AAA   PLAN:     *  Radiology will perform MRI/MRCP tomorrow if it is ordered stat:  I placed the orders.  Can eat tonight.   *  Fentanyl in ED has helped pain a lot, suggest he get dose of this prior to going down for MRI tomorrow.   *  Dr Sallyanne Kuster is his cardiologist, may need him on board.    Azucena Freed  10/17/2015, 4:47 PM Pager: [phone]     [facility] Attending  I have also seen and assessed the patient and agree with the advanced practitioner's assessment and plan. I think he may indeed have cholangiocarcinoma.  1) MRCP/MRI 2) Next steps could be IR PTC or ERCP (OFF Eliquis) - INR is 2.2 3) Hematology evaluation of thrombocytopenia appropriate 4) Medicate for pain prior to MR  Gatha Mayer, MD, [facility] [phone] (pager) 10/02/2015 7:19 PM

## 2015-10-24 NOTE — ED Notes (Signed)
Bed: WA17 Expected date:  Expected time:  Means of arrival:  Comments: New CA diagnosis

## 2015-10-25 ENCOUNTER — Inpatient Hospital Stay (HOSPITAL_COMMUNITY): Payer: Medicare Other

## 2015-10-25 DIAGNOSIS — I482 Chronic atrial fibrillation: Secondary | ICD-10-CM

## 2015-10-25 LAB — COMPREHENSIVE METABOLIC PANEL
ALK PHOS: 401 U/L — AB (ref 38–126)
ALT: 26 U/L (ref 17–63)
AST: 35 U/L (ref 15–41)
Albumin: 2.7 g/dL — ABNORMAL LOW (ref 3.5–5.0)
Anion gap: 8 (ref 5–15)
BILIRUBIN TOTAL: 1 mg/dL (ref 0.3–1.2)
BUN: 36 mg/dL — AB (ref 6–20)
CALCIUM: 8.4 mg/dL — AB (ref 8.9–10.3)
CHLORIDE: 103 mmol/L (ref 101–111)
CO2: 24 mmol/L (ref 22–32)
CREATININE: 0.76 mg/dL (ref 0.61–1.24)
Glucose, Bld: 111 mg/dL — ABNORMAL HIGH (ref 65–99)
Potassium: 4 mmol/L (ref 3.5–5.1)
SODIUM: 135 mmol/L (ref 135–145)
TOTAL PROTEIN: 5.8 g/dL — AB (ref 6.5–8.1)

## 2015-10-25 LAB — CBC
HCT: 28.4 % — ABNORMAL LOW (ref 39.0–52.0)
Hemoglobin: 9.2 g/dL — ABNORMAL LOW (ref 13.0–17.0)
MCH: 27.1 pg (ref 26.0–34.0)
MCHC: 32.4 g/dL (ref 30.0–36.0)
MCV: 83.8 fL (ref 78.0–100.0)
PLATELETS: 76 10*3/uL — AB (ref 150–400)
RBC: 3.39 MIL/uL — AB (ref 4.22–5.81)
RDW: 15.5 % (ref 11.5–15.5)
WBC: 12.1 10*3/uL — AB (ref 4.0–10.5)

## 2015-10-25 LAB — APTT
APTT: 81 s — AB (ref 24–37)
APTT: 58 s — AB (ref 24–37)

## 2015-10-25 LAB — HEPARIN LEVEL (UNFRACTIONATED)
HEPARIN UNFRACTIONATED: 1.8 [IU]/mL — AB (ref 0.30–0.70)
HEPARIN UNFRACTIONATED: 1.5 [IU]/mL — AB (ref 0.30–0.70)

## 2015-10-25 MED ORDER — METOPROLOL TARTRATE 25 MG PO TABS
25.0000 mg | ORAL_TABLET | Freq: Once | ORAL | Status: AC
  Administered 2015-10-25: 25 mg via ORAL
  Filled 2015-10-25: qty 1

## 2015-10-25 MED ORDER — ENOXAPARIN SODIUM 80 MG/0.8ML ~~LOC~~ SOLN
1.0000 mg/kg | Freq: Two times a day (BID) | SUBCUTANEOUS | Status: DC
  Administered 2015-10-26 – 2015-10-27 (×3): 80 mg via SUBCUTANEOUS
  Filled 2015-10-25 (×5): qty 0.8

## 2015-10-25 MED ORDER — SODIUM CHLORIDE 0.9 % IV BOLUS (SEPSIS)
500.0000 mL | Freq: Once | INTRAVENOUS | Status: AC
  Administered 2015-10-25: 500 mL via INTRAVENOUS

## 2015-10-25 MED ORDER — METOPROLOL SUCCINATE ER 25 MG PO TB24
100.0000 mg | ORAL_TABLET | Freq: Every day | ORAL | Status: DC
  Administered 2015-10-26 – 2015-10-27 (×2): 100 mg via ORAL
  Filled 2015-10-25 (×2): qty 1

## 2015-10-25 MED ORDER — LORAZEPAM 2 MG/ML IJ SOLN
0.5000 mg | Freq: Once | INTRAMUSCULAR | Status: DC
  Filled 2015-10-25: qty 1

## 2015-10-25 MED ORDER — SODIUM CHLORIDE 0.9 % IV SOLN
INTRAVENOUS | Status: DC
  Administered 2015-10-25 – 2015-10-27 (×4): via INTRAVENOUS

## 2015-10-25 MED ORDER — HEPARIN (PORCINE) IN NACL 100-0.45 UNIT/ML-% IJ SOLN
1400.0000 [IU]/h | INTRAMUSCULAR | Status: DC
  Filled 2015-10-25: qty 250

## 2015-10-25 MED ORDER — GADOBENATE DIMEGLUMINE 529 MG/ML IV SOLN
20.0000 mL | Freq: Once | INTRAVENOUS | Status: AC | PRN
  Administered 2015-10-25: 16 mL via INTRAVENOUS

## 2015-10-25 MED ORDER — ENOXAPARIN SODIUM 100 MG/ML ~~LOC~~ SOLN
100.0000 mg | Freq: Once | SUBCUTANEOUS | Status: AC
  Administered 2015-10-25: 100 mg via SUBCUTANEOUS
  Filled 2015-10-25: qty 1

## 2015-10-25 MED ORDER — LORAZEPAM 2 MG/ML IJ SOLN
0.5000 mg | Freq: Once | INTRAMUSCULAR | Status: AC
Start: 2015-10-25 — End: 2015-10-25
  Administered 2015-10-25: 0.5 mg via INTRAVENOUS
  Filled 2015-10-25: qty 1

## 2015-10-25 NOTE — Progress Notes (Signed)
ANTICOAGULATION CONSULT NOTE - Follow Up Consult  Pharmacy Consult for Heparin >> Lovenox Indication: DVT  Allergies  Allergen Reactions  . Pradaxa [Dabigatran Etexilate Mesylate]     Caused intolerable reflux sxs.   Alveda Reasons [Rivaroxaban]     Caused bleeding of the gums.     Patient Measurements: Height: 5\' 10"  (177.8 cm) Weight: 178 lb 5.6 oz (80.9 kg) IBW/kg (Calculated) : 73  Vital Signs: Temp: 97.8 F (36.6 C) (10/29 1200) Temp Source: Oral (10/29 1200) BP: 149/87 mmHg (10/29 1400) Pulse Rate: 91 (10/29 1400)  Labs:  Recent Labs  10/23/15 1225 10/23/2015 1454 10/18/2015 1510 09/30/2015 1644 10/25/15 0303 10/25/15 1059  HGB 10.7* 9.8*  --   --  9.2*  --   HCT 32.2* 29.5*  --   --  28.4*  --   PLT 51.0 Repeated and verified X2.* 54*  --   --  76*  --   APTT  --   --   --  44* 81* 58*  LABPROT 23.8*  --   --  25.2*  --   --   INR 2.2*  --   --  2.32*  --   --   HEPARINUNFRC  --   --  >2.20*  --  1.80* 1.50*  CREATININE 0.75 0.72  --   --  0.76  --     Estimated Creatinine Clearance: 79.8 mL/min (by C-G formula based on Cr of 0.76).   Medications:  Infusions:  . sodium chloride 75 mL/hr at 10/25/15 1004    Assessment: 21 yoM on chronic anticoagulation with apixaban for hx Afib presents with tachycardia, pain, and possible metastatic cholangiocarcinoma, found to have acute bilateral PEs.  Pharmacy consulted to transition pt to IV heparin.  Apixaban can cause false elevations in INR and heparin level therefore checking aPTTs until apixaban cleared systemically.  Last dose apixaban 10/28 @ 0600.  Also baseline thrombocytopenia, will monitor closely for bleeding.  To transition from heparin to Lovenox today.  Today, 10/25/2015:  No bleeding noted.  Plts improved slightly.  Hgb trending down slowly.  Stable renal function.     Goal of Therapy: Anti-Xa level 0.6-1 units/ml 4hrs after LMWH dose given Prevention of VTE Monitor platelets by anticoagulation  protocol: Yes  Plan:  Stop IV heparin  Lovenox 100 mg SQ x1 to start 1 hr after heparin turned off, then 80 mg SQ q12 hr  SCr, CBC at least q72 hr while on Tx-dose Lovenox  Monitor for signs of bleeding or worsening thrombosis   Reuel Boom, PharmD, BCPS Pager: [phone] 10/25/2015, 4:11 PM

## 2015-10-25 NOTE — Progress Notes (Signed)
Received from SDU, agree with previous RN's assessment. 

## 2015-10-25 NOTE — Progress Notes (Addendum)
Pt placed on 2 LPM O2 due to Sats being between 87 and 89%, RT to monitor and assess as needed.

## 2015-10-25 NOTE — Progress Notes (Signed)
ANTICOAGULATION CONSULT NOTE - Follow Up Consult  Pharmacy Consult for Heparin Indication: DVT  Allergies  Allergen Reactions  . Pradaxa [Dabigatran Etexilate Mesylate]     Caused intolerable reflux sxs.   Alveda Reasons [Rivaroxaban]     Caused bleeding of the gums.     Patient Measurements: Height: 5\' 10"  (177.8 cm) Weight: 178 lb 5.6 oz (80.9 kg) IBW/kg (Calculated) : 73 Heparin Dosing Weight:   Vital Signs: Temp: 98.6 F (37 C) (10/29 0100) Temp Source: Oral (10/29 0100) BP: 132/103 mmHg (10/29 0400) Pulse Rate: 99 (10/29 0400)  Labs:  Recent Labs  10/23/15 1225 10/09/2015 1454 10/16/2015 1510 10/07/2015 1644 10/25/15 0303  HGB 10.7* 9.8*  --   --  9.2*  HCT 32.2* 29.5*  --   --  28.4*  PLT 51.0 Repeated and verified X2.* 54*  --   --  76*  APTT  --   --   --  44* 81*  LABPROT 23.8*  --   --  25.2*  --   INR 2.2*  --   --  2.32*  --   HEPARINUNFRC  --   --  >2.20*  --  1.80*  CREATININE 0.75 0.72  --   --  0.76    Estimated Creatinine Clearance: 79.8 mL/min (by C-G formula based on Cr of 0.76).   Medications:  Infusions:  . heparin 1,300 Units/hr (10/25/15 0400)    Assessment: Patient with PTT at goal.  Heparin level high but believe that to be due to prior apixaban.  PTT ordered with Heparin level until both correlate due to possible drug-lab interaction between rivaroxaban and anti-Xa level (aka heparin level)   Goal of Therapy:  Heparin level 0.3-0.7 units/ml aPTT 66-102 seconds Monitor platelets by anticoagulation protocol: Yes   Plan:  Continue heparin drip at current rate Recheck level and PTT at [place], Shea Stakes [place] 10/25/2015,5:22 AM

## 2015-10-25 NOTE — Progress Notes (Addendum)
ANTICOAGULATION CONSULT NOTE - Follow Up Consult  Pharmacy Consult for Heparin Indication: DVT  Allergies  Allergen Reactions  . Pradaxa [Dabigatran Etexilate Mesylate]     Caused intolerable reflux sxs.   Alveda Reasons [Rivaroxaban]     Caused bleeding of the gums.     Patient Measurements: Height: 5\' 10"  (177.8 cm) Weight: 178 lb 5.6 oz (80.9 kg) IBW/kg (Calculated) : 73 Heparin Dosing Weight: 80.9kg  Vital Signs: Temp: 97.6 F (36.4 C) (10/29 0800) Temp Source: Oral (10/29 0800) BP: 133/98 mmHg (10/29 1100) Pulse Rate: 111 (10/29 1100)  Labs:  Recent Labs  10/23/15 1225 10/21/2015 1454 09/30/2015 1510 10/07/2015 1644 10/25/15 0303 10/25/15 1059  HGB 10.7* 9.8*  --   --  9.2*  --   HCT 32.2* 29.5*  --   --  28.4*  --   PLT 51.0 Repeated and verified X2.* 54*  --   --  76*  --   APTT  --   --   --  44* 81* 58*  LABPROT 23.8*  --   --  25.2*  --   --   INR 2.2*  --   --  2.32*  --   --   HEPARINUNFRC  --   --  >2.20*  --  1.80* 1.50*  CREATININE 0.75 0.72  --   --  0.76  --     Estimated Creatinine Clearance: 79.8 mL/min (by C-G formula based on Cr of 0.76).   Medications:  Infusions:  . sodium chloride 75 mL/hr at 10/25/15 1004  . heparin 1,300 Units/hr (10/25/15 1057)    Assessment: 46 yoM on chronic anticoagulation with apixaban for hx Afib presents with tachycardia, pain, and possible metastatic cholangiocarcinoma, found to have acute bilateral PEs.  Pharmacy consulted to transition pt to IV heparin.  Apixaban can cause false elevations in INR and heparin level therefore checking aPTTs until apixaban cleared systemically.  Last dose apixaban 10/28 @ 0600.  Also baseline thrombocytopenia, will monitor closely for bleeding.    10/29: Confirmation aPTT is just under therapeutic goal (66-102s).  HL remains supratx but trending down (correlating with apixaban clearing).  No bleeding noted.   Plts improved slightly. Hgb trending down slowly.  Stable renal function.     Goal of Therapy:  Heparin level 0.3-0.7 units/ml aPTT 66-102 seconds Monitor platelets by anticoagulation protocol: Yes   Plan:  Increase heparin drip to 1400 units/hr = 40ml/hr.  F/u aPTT in 6 hours.   Check daily aPTT/HLs.  Once daily HL correlates with aPTT, can stop checking aPTT and only follow HL.   F/u plans for transition to LMWH due to underling malignancy  Neela Zecca E 10/25/2015,12:00 PM

## 2015-10-25 NOTE — Progress Notes (Signed)
[facility] PROGRESS NOTE  Brandon Dean JXB:[id number] DOB: 02-Jun-1936 DOA: 10/07/2015 PCP: Tivis Ringer, MD  Assessment/Plan: 1. Pulmonary embolism. -Brandon Dean having a history of atrial fibrillation who had been anticoagulating with Eliquis therapy, having imaging studies showing probable cholangiocarcinoma. -CT scan of lungs with IV contrast revealed bilateral pulmonary emboli. -He was initially started on IV heparin with the discontinuation of Eliquis.  -Overnight he remained hemodynamically stable.  -Will transition to subcutaneous Lovenox. Pharmacy was consulted for the discontinuation of IV heparin and initiating Lovenox therapy. He will need to be off Lovenox indefinitely.   2.  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 -Brandon Dean having a CT scan of abdomen and pelvis that was performed on 10/21/2015 that showed abnormal enhancement along the walls of the distended common bile duct that were highly suspicious for cholangiocarcinoma. This was further worked up with an MRCP which have demonstrated findings concerning for cholangiocarcinoma with liver metastasis. -Patient will require tissue diagnosis which could be obtained from CT-guided biopsy versus ERCP. -Will await further recommendations from GI.  3.  Atrial fibrillation CHADSVasc Score of 3.  -Patient with history of chronic atrial fibrillation currently on metoprolol 50 mg daily. He was also presented taking Eliquis for CVA prophylaxis. -Anticoagulation will be changed to Lovenox given development of pulmonary embolism in the context of malignancy -Patient remains hypertensive with ventricular rates maintaining in the 100s, will increase his metoprolol dose to 100 mg daily.  4.  Coagulopathy.  -Labs showing INR of 2.32 which could be due to underlying liver involvement from malignancy. He was also on Eliquis therapy which can increase INR as well.  5.  Massive scrotal hernia. -Having acute pulmonary embolism he is at a high  surgical risk presently. -He can be evaluated in the outpatient setting for hernia repair. At the present time this does not appear to be strangulated, is having regular bowel movements.  6.  Thrombocytopenia. -Platelet count at 76,000 on 10/25/2015 -Likely secondary to liver disease. -Will continue to monitor closely as he is on anticoagulation for treatment of pulmonary embolism  Code Status: Full code Family Communication: I spoke to his family members were present at bedside Disposition Plan: Plan to transfer to telemetry  Consultants:  GI  Procedures:  MRCP performed on 10/25/2015   HPI/Subjective: Brandon.Dean is a pleasant 79 year old gentleman with a past medical history of chronic atrial fibrillation who had been chronically anticoagulated with Eliquis, also having a history of scrotal hernia who is highly functional at baseline. He recently had a CT scan of abdomen and pelvis performed on 10/21/2015 that revealed on abnormal enhancement along the walls of the common bile duct highly suspicious for cholangiocarcinoma. He was admitted for further workup. A CT scan of lungs with IV contrast revealed multiple bilateral pulmonary emboli. Also noted were bilateral pulmonary nodules suspicious for metastatic disease. He was admitted to the step down unit and started on IV heparin. With regard to liver findings he was further worked up with MRCP. This study revealed progressive focal intrahepatic biliary dilatation in the lateral segment of the left hepatic lobe worrisome for intrahepatic cholangiocarcinoma. Radiology also reported numerous new rim-enhancing lesions throughout both lobes measuring up to 11 mm compatible with metastasis.  Objective: Filed Vitals:   10/25/15 1627  BP: 134/81  Pulse: 100  Temp:   Resp:     Intake/Output Summary (Last 24 hours) at 10/25/15 1634 Last data filed at 10/25/15 1200  Gross per 24 hour  Intake    666 ml  Output  200 ml  Net    466 ml    Filed Weights   10/17/2015 1708 10/14/2015 1845  Weight: 82.464 kg (181 lb 12.8 oz) 80.9 kg (178 lb 5.6 oz)    Exam:   General:  Patient is awake and alert, sitting at bedside chair, no acute distress  Cardiovascular: irregular rate and rhythm normal S1-S2 no murmurs rubs or gallops   Respiratory: Normal respiratory effort, lungs overall clear to auscultation bilaterally   Abdomen: Soft nontender nondistended positive bowel sounds  Musculoskeletal:  no edema   Genitourinary : There is a massive left sided scrotal hernia present, nontender, reducible, unchanged from yesterday exam.    Data Reviewed: Basic Metabolic Panel:  Recent Labs Lab 10/23/15 1225 10/09/2015 1454 10/25/15 0303  NA 133* 133* 135  K 4.2 4.1 4.0  CL 101 103 103  CO2 23 21* 24  GLUCOSE 118* 115* 111*  BUN 34* 35* 36*  CREATININE 0.75 0.72 0.76  CALCIUM 8.8 8.7* 8.4*   Liver Function Tests:  Recent Labs Lab 10/23/15 1225 10/12/2015 1454 10/25/15 0303  AST 38* 47* 35  ALT 28 30 26   ALKPHOS 420* 440* 401*  BILITOT 1.5* 1.4* 1.0  PROT 6.3 6.2* 5.8*  ALBUMIN 3.1* 3.0* 2.7*   No results for input(s): LIPASE, AMYLASE in the last 168 hours. No results for input(s): AMMONIA in the last 168 hours. CBC:  Recent Labs Lab 10/23/15 1225 10/05/2015 1454 10/25/15 0303  WBC 13.3* 12.8* 12.1*  NEUTROABS 11.2* 10.8*  --   HGB 10.7* 9.8* 9.2*  HCT 32.2* 29.5* 28.4*  MCV 82.1 82.9 83.8  PLT 51.0 Repeated and verified X2.* 54* 76*   Cardiac Enzymes: No results for input(s): CKTOTAL, CKMB, CKMBINDEX, TROPONINI in the last 168 hours. BNP (last 3 results)  Recent Labs  10/25/2015 1530  BNP 490.6*    ProBNP (last 3 results) No results for input(s): PROBNP in the last 8760 hours.  CBG: No results for input(s): GLUCAP in the last 168 hours.  Recent Results (from the past 240 hour(s))  MRSA PCR Screening     Status: None   Collection Time: 10/04/2015  6:48 PM  Result Value Ref Range Status   MRSA by  PCR NEGATIVE NEGATIVE Final    Comment:        The GeneXpert MRSA Assay (FDA approved for NASAL specimens only), is one component of a comprehensive MRSA colonization surveillance program. It is not intended to diagnose MRSA infection nor to guide or monitor treatment for MRSA infections.      Studies: Ct Angio Chest Pe W/cm &/or Wo Cm  10/23/2015  CLINICAL DATA:  Shortness of breath, tachycardia, atrial fibrillation, former smoker, history cholangiocarcinoma EXAM: CT ANGIOGRAPHY CHEST WITH CONTRAST TECHNIQUE: Multidetector CT imaging of the chest was performed using the standard protocol during bolus administration of intravenous contrast. Multiplanar CT image reconstructions and MIPs were obtained to evaluate the vascular anatomy. CONTRAST:  121mL OMNIPAQUE IOHEXOL 350 MG/ML SOLN IV COMPARISON:  02/26/2015 FINDINGS: Aneurysmal dilatation ascending thoracic aorta 4.5 cm transverse image 41 previously 4.3 cm. Scattered atherosclerotic calcifications aorta and coronary arteries. Enlargement of cardiac chambers particularly atria. Numerous small BILATERAL pulmonary arterial filling defects throughout all lobes consistent with pulmonary emboli, including small saddle emboli at the RIGHT upper and RIGHT lower lobes an within LEFT lower lobe branches. Elevated RV/LV ratio = 1.25 consistent with RIGHT heart strain. Enhancing nodule within the mid thoracic esophagus on the previous exam no longer identified though the mid the  esophagus does demonstrate mild wall thickening. No thoracic adenopathy. Visualized upper abdomen significant for mild ascites. Small BILATERAL pleural effusions RIGHT greater than LEFT. Atelectasis RIGHT lower lobe. Numerous new nodular foci in both lungs as well as a focus of questionable nodularity versus infiltrate in the anterior RIGHT upper lobe 13 mm diameter image 43. Segmental bronchiectasis anterior basal RIGHT lower lobe. No infiltrate or pneumothorax. No acute osseous  findings. Review of the MIP images confirms the above findings. IMPRESSION: Multiple BILATERAL pulmonary emboli. Positive for acute PE with CT evidence of right heart strain (RV/LV Ratio = 1.25) consistent with at least submassive (intermediate risk) PE. The presence of right heart strain has been associated with an increased risk of morbidity and mortality. Please activate Code PE by paging [phone]. Enlargement of cardiac chambers particularly both atria. Aneurysmal dilatation ascending thoracic aorta, recommendation below. Ascending thoracic aortic aneurysm. Recommend semi-annual imaging followup by CTA or MRA and referral to cardiothoracic surgery if not already obtained. This recommendation follows 2010 ACCF/AHA/AATS/ACR/ASA/SCA/SCAI/SIR/STS/SVM Guidelines for the Diagnosis and Management of Patients With Thoracic Aortic Disease. Circulation. 2010; 121: P929-W446 New multiple BILATERAL pulmonary nodules suspicious for metastatic disease in this patient with a history of cholangiocarcinoma. Small BILATERAL pleural effusions with upper abdominal ascites noted. Critical Value/emergent results were called by telephone at the time of interpretation on 10/21/2015 at 1644 hours to Dr. Leo Grosser , who verbally acknowledged these results. Electronically Signed   By: Lavonia Dana M.D.   On: 10/19/2015 16:44   Brandon Dean [facility]  10/25/2015  CLINICAL DATA:  Suspected cholangiocarcinoma, elevated CA 19-9 EXAM: MRI ABDOMEN WITHOUT AND WITH CONTRAST (INCLUDING MRCP) TECHNIQUE: Multiplanar multisequence Brandon imaging of the abdomen was performed both before and after the administration of intravenous contrast. Heavily T2-weighted images of the biliary and pancreatic ducts were obtained, and three-dimensional MRCP images were rendered by post processing. CONTRAST:  67mL MULTIHANCE GADOBENATE DIMEGLUMINE 529 MG/ML IV SOLN COMPARISON:  CT abdomen pelvis dated 10/21/2015 and 02/26/2015 FINDINGS: Severely motion  degraded images. Lower chest: Small bilateral pleural effusions, right greater than left. Cardiomegaly. Hepatobiliary: Focal biliary dilatation in the lateral segment left hepatic lobe (series 10/image 24; series 1104/image 40), progressive from 02/2015, worrisome for underlying intrahepatic cholangiocarcinoma. Numerous rim enhancing lesions throughout both lobes, measuring up to 11 mm (series 1104/image 30), new from 02/2015, compatible with metastases. Distended gallbladder with multiple layering gallstones. Dilated common duct, measuring 12 mm (series 400/ image 82). At least one 10 mm mid/distal CBD stone is present (series 3/ image 39). Pancreas: Within normal limits. Spleen: Within normal limits. Adrenals/Urinary Tract: Adrenal glands are within normal limits. Multiple bilateral renal cysts measuring up to 8.1 cm in the anterior right lower kidney (series 10/ image 41). Hemorrhagic left renal cysts measuring up to 1.7 cm (series 10/ image 35). No hydronephrosis. Stomach/Bowel: Stomach and visualized bowel are grossly unremarkable. Vascular/Lymphatic: No evidence of abdominal aortic aneurysm. Suspected upper abdominal/retroperitoneal lymph nodes are better evaluated on CT due to motion degradation. Other: Small volume abdominal ascites. Musculoskeletal: No focal osseous lesions. IMPRESSION: Severely motion degraded images. Progressive focal intrahepatic biliary dilation in the lateral segment left hepatic lobe, worrisome for intrahepatic cholangiocarcinoma. Numerous new rim enhancing lesions throughout both lobes, measuring up to 11 mm, compatible with metastases. Suspected upper abdominal/retroperitoneal lymph nodes are better evaluated on recent CT due to motion degradation. Cholelithiasis with choledocholithiasis. Common duct measures 11 mm and is notable for at least one mid/distal CBD stone measuring 10 mm. Additional ancillary findings as  above. Electronically Signed   By: Julian Hy M.D.   On:  10/25/2015 13:39   Brandon Abd W/wo Cm/mrcp  10/25/2015  CLINICAL DATA:  Suspected cholangiocarcinoma, elevated CA 19-9 EXAM: MRI ABDOMEN WITHOUT AND WITH CONTRAST (INCLUDING MRCP) TECHNIQUE: Multiplanar multisequence Brandon imaging of the abdomen was performed both before and after the administration of intravenous contrast. Heavily T2-weighted images of the biliary and pancreatic ducts were obtained, and three-dimensional MRCP images were rendered by post processing. CONTRAST:  6mL MULTIHANCE GADOBENATE DIMEGLUMINE 529 MG/ML IV SOLN COMPARISON:  CT abdomen pelvis dated 10/21/2015 and 02/26/2015 FINDINGS: Severely motion degraded images. Lower chest: Small bilateral pleural effusions, right greater than left. Cardiomegaly. Hepatobiliary: Focal biliary dilatation in the lateral segment left hepatic lobe (series 10/image 24; series 1104/image 40), progressive from 02/2015, worrisome for underlying intrahepatic cholangiocarcinoma. Numerous rim enhancing lesions throughout both lobes, measuring up to 11 mm (series 1104/image 30), new from 02/2015, compatible with metastases. Distended gallbladder with multiple layering gallstones. Dilated common duct, measuring 12 mm (series 400/ image 82). At least one 10 mm mid/distal CBD stone is present (series 3/ image 39). Pancreas: Within normal limits. Spleen: Within normal limits. Adrenals/Urinary Tract: Adrenal glands are within normal limits. Multiple bilateral renal cysts measuring up to 8.1 cm in the anterior right lower kidney (series 10/ image 41). Hemorrhagic left renal cysts measuring up to 1.7 cm (series 10/ image 35). No hydronephrosis. Stomach/Bowel: Stomach and visualized bowel are grossly unremarkable. Vascular/Lymphatic: No evidence of abdominal aortic aneurysm. Suspected upper abdominal/retroperitoneal lymph nodes are better evaluated on CT due to motion degradation. Other: Small volume abdominal ascites. Musculoskeletal: No focal osseous lesions. IMPRESSION:  Severely motion degraded images. Progressive focal intrahepatic biliary dilation in the lateral segment left hepatic lobe, worrisome for intrahepatic cholangiocarcinoma. Numerous new rim enhancing lesions throughout both lobes, measuring up to 11 mm, compatible with metastases. Suspected upper abdominal/retroperitoneal lymph nodes are better evaluated on recent CT due to motion degradation. Cholelithiasis with choledocholithiasis. Common duct measures 11 mm and is notable for at least one mid/distal CBD stone measuring 10 mm. Additional ancillary findings as above. Electronically Signed   By: Julian Hy M.D.   On: 10/25/2015 13:39    Scheduled Meds: . enoxaparin (LOVENOX) injection  100 mg Subcutaneous Once  . [START ON 10/26/2015] enoxaparin (LOVENOX) injection  1 mg/kg Subcutaneous Q12H  . LORazepam  0.5 mg Intravenous Once  . [START ON 10/26/2015] metoprolol succinate  100 mg Oral Daily  . sodium chloride  3 mL Intravenous Q12H  . sodium chloride  3 mL Intravenous Q12H   Continuous Infusions: . sodium chloride 75 mL/hr at 10/25/15 1004    Principal Problem:   Pulmonary embolism ([place]) Active Problems:   Cholangiocarcinoma ([place])   HTN (hypertension)   Dyslipidemia    Time spent: 35 min    Kelvin Cellar  [facility] Pager [phone]. If 7PM-7AM, please contact night-coverage at [email or web address], password [facility] 10/25/2015, 4:34 PM  LOS: 1 day

## 2015-10-26 DIAGNOSIS — R160 Hepatomegaly, not elsewhere classified: Secondary | ICD-10-CM | POA: Insufficient documentation

## 2015-10-26 DIAGNOSIS — E785 Hyperlipidemia, unspecified: Secondary | ICD-10-CM

## 2015-10-26 DIAGNOSIS — K839 Disease of biliary tract, unspecified: Secondary | ICD-10-CM | POA: Insufficient documentation

## 2015-10-26 DIAGNOSIS — K831 Obstruction of bile duct: Secondary | ICD-10-CM

## 2015-10-26 LAB — CBC
HEMATOCRIT: 28.7 % — AB (ref 39.0–52.0)
HEMOGLOBIN: 9.2 g/dL — AB (ref 13.0–17.0)
MCH: 27.1 pg (ref 26.0–34.0)
MCHC: 32.1 g/dL (ref 30.0–36.0)
MCV: 84.7 fL (ref 78.0–100.0)
Platelets: 104 10*3/uL — ABNORMAL LOW (ref 150–400)
RBC: 3.39 MIL/uL — AB (ref 4.22–5.81)
RDW: 15.6 % — ABNORMAL HIGH (ref 11.5–15.5)
WBC: 12.6 10*3/uL — AB (ref 4.0–10.5)

## 2015-10-26 LAB — BASIC METABOLIC PANEL
ANION GAP: 6 (ref 5–15)
BUN: 29 mg/dL — ABNORMAL HIGH (ref 6–20)
CALCIUM: 8.3 mg/dL — AB (ref 8.9–10.3)
CO2: 25 mmol/L (ref 22–32)
Chloride: 105 mmol/L (ref 101–111)
Creatinine, Ser: 0.72 mg/dL (ref 0.61–1.24)
GLUCOSE: 127 mg/dL — AB (ref 65–99)
POTASSIUM: 3.9 mmol/L (ref 3.5–5.1)
Sodium: 136 mmol/L (ref 135–145)

## 2015-10-26 MED ORDER — MILK AND MOLASSES ENEMA
1.0000 | Freq: Every day | RECTAL | Status: DC | PRN
  Filled 2015-10-26: qty 250

## 2015-10-26 MED ORDER — SENNOSIDES-DOCUSATE SODIUM 8.6-50 MG PO TABS
1.0000 | ORAL_TABLET | Freq: Two times a day (BID) | ORAL | Status: DC
  Administered 2015-10-26 – 2015-10-27 (×2): 1 via ORAL
  Filled 2015-10-26 (×4): qty 1

## 2015-10-26 MED ORDER — SENNOSIDES-DOCUSATE SODIUM 8.6-50 MG PO TABS: 1.0000 | ORAL_TABLET | Freq: Two times a day (BID) | ORAL | Status: DC

## 2015-10-26 MED ORDER — POLYETHYLENE GLYCOL 3350 17 G PO PACK
17.0000 g | PACK | Freq: Every day | ORAL | Status: DC
  Administered 2015-10-26 – 2015-10-27 (×2): 17 g via ORAL
  Filled 2015-10-26 (×2): qty 1

## 2015-10-26 MED ORDER — LACTULOSE 10 GM/15ML PO SOLN: 20.0000 g | Freq: Once | ORAL | Status: DC

## 2015-10-26 MED ORDER — BISACODYL 10 MG RE SUPP: 10.0000 mg | Freq: Once | RECTAL | Status: DC

## 2015-10-26 NOTE — Progress Notes (Signed)
[facility] PROGRESS NOTE  Brandon Dean WUJ:[id number] DOB: [birth year] DOA: 10/14/2015 PCP: Tivis Ringer, MD  Interim Summary Brandon Dean is a pleasant 79 year old gentleman with a past medical history of chronic atrial fibrillation who had been chronically anticoagulated with Eliquis, also having a history of scrotal hernia who is highly functional at baseline. He recently had a CT scan of abdomen and pelvis performed on 10/21/2015 that revealed on abnormal enhancement along the walls of the common bile duct highly suspicious for cholangiocarcinoma. He was admitted for further workup. A CT scan of lungs with IV contrast revealed multiple bilateral pulmonary emboli. Also noted were bilateral pulmonary nodules suspicious for metastatic disease. He was admitted to the step down unit and started on IV heparin. With regard to liver findings he was further worked up with MRCP. This study revealed progressive focal intrahepatic biliary dilatation in the lateral segment of the left hepatic lobe worrisome for intrahepatic cholangiocarcinoma. Radiology also reported numerous new rim-enhancing lesions throughout both lobes measuring up to 11 mm compatible with metastasis.  Assessment/Plan: 1. Pulmonary embolism. -Brandon Dean having a history of atrial fibrillation who had been anticoagulating with Eliquis therapy, having imaging studies showing probable cholangiocarcinoma. -CT scan of lungs with IV contrast revealed bilateral pulmonary emboli. -He was initially started on IV heparin with the discontinuation of Eliquis.  -Overnight he remained hemodynamically stable.  -Will transition to subcutaneous Lovenox. Pharmacy was consulted for the discontinuation of IV heparin and initiating Lovenox therapy.  -In the setting of malignancy he will require [place] Lovenox on discharge.  -He is showing clinical improvement, now off supplemental oxygen.   2.  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 -Brandon Dean having a CT scan  of abdomen and pelvis that was performed on 10/21/2015 that showed abnormal enhancement along the walls of the distended common bile duct that were highly suspicious for cholangiocarcinoma. This was further worked up with an MRCP which have demonstrated findings concerning for cholangiocarcinoma with liver metastasis. -Case was discussed with Dr Carlean Purl who recommends CT guided biopsy of liver lesions, which will be planned for Monday 10/27/2015  3.  Atrial fibrillation CHADSVasc Score of 3.  -Patient with history of chronic atrial fibrillation on metoprolol and Eliquis therapy prior to this hospitalization. -Anticoagulation will be changed to Lovenox given development of pulmonary embolism in the context of malignancy -On 10/25/2015 I increased his Metoproplol to 100 mg PO q daily for better blood pressure and rate control. Overall blood pressures improved.   4.  Coagulopathy.  -Labs showing INR of 2.32 which could be due to underlying liver involvement from malignancy. He was also on Eliquis therapy which can increase INR as well.  5.  Massive scrotal hernia. -Having acute pulmonary embolism he is at a high surgical risk presently. -He can be evaluated in the outpatient setting for hernia repair. At the present time this does not appear to be strangulated, is having regular bowel movements.  6.  Thrombocytopenia. -Platelet count improved to 104,000 -Will continue to monitor closely as he is on anticoagulation for treatment of pulmonary embolism  7.  Constipation  -Likely as a result of decreased mobility and narcotic analgesics.  -Will place him on a bowel regimen with Senna-K and Miralax.   Code Status: Full code Family Communication: I spoke to his family members were present at bedside Disposition Plan: Plan for CT guided biopsy in am.   Consultants:  GI  Procedures:  MRCP performed on 10/25/2015   HPI/Subjective: He was assisted out of bed to chair. States resting  better  over night.   Objective: Filed Vitals:   10/26/15 0908  BP: 146/78  Pulse: 100  Temp:   Resp:     Intake/Output Summary (Last 24 hours) at 10/26/15 1229 Last data filed at 10/26/15 1100  Gross per 24 hour  Intake   1170 ml  Output    100 ml  Net   1070 ml   Filed Weights   10/18/2015 1708 10/23/2015 1845  Weight: 82.464 kg (181 lb 12.8 oz) 80.9 kg (178 lb 5.6 oz)    Exam:   General:  Patient is awake and alert, sitting at bedside chair, no acute distress  Cardiovascular: irregular rate and rhythm normal S1-S2 no murmurs rubs or gallops   Respiratory: Normal respiratory effort, lungs overall clear to auscultation bilaterally   Abdomen: Soft nontender nondistended positive bowel sounds  Musculoskeletal:  no edema   Genitourinary : There is a massive left sided scrotal hernia present, nontender, reducible, unchanged from yesterday exam.    Data Reviewed: Basic Metabolic Panel:  Recent Labs Lab 10/23/15 1225 10/27/2015 1454 10/25/15 0303 10/26/15 0445  NA 133* 133* 135 136  K 4.2 4.1 4.0 3.9  CL 101 103 103 105  CO2 23 21* 24 25  GLUCOSE 118* 115* 111* 127*  BUN 34* 35* 36* 29*  CREATININE 0.75 0.72 0.76 0.72  CALCIUM 8.8 8.7* 8.4* 8.3*   Liver Function Tests:  Recent Labs Lab 10/23/15 1225 10/18/2015 1454 10/25/15 0303  AST 38* 47* 35  ALT 28 30 26   ALKPHOS 420* 440* 401*  BILITOT 1.5* 1.4* 1.0  PROT 6.3 6.2* 5.8*  ALBUMIN 3.1* 3.0* 2.7*   No results for input(s): LIPASE, AMYLASE in the last 168 hours. No results for input(s): AMMONIA in the last 168 hours. CBC:  Recent Labs Lab 10/23/15 1225 10/14/2015 1454 10/25/15 0303 10/26/15 0445  WBC 13.3* 12.8* 12.1* 12.6*  NEUTROABS 11.2* 10.8*  --   --   HGB 10.7* 9.8* 9.2* 9.2*  HCT 32.2* 29.5* 28.4* 28.7*  MCV 82.1 82.9 83.8 84.7  PLT 51.0 Repeated and verified X2.* 54* 76* 104*   Cardiac Enzymes: No results for input(s): CKTOTAL, CKMB, CKMBINDEX, TROPONINI in the last 168 hours. BNP (last 3  results)  Recent Labs  10/09/2015 1530  BNP 490.6*    ProBNP (last 3 results) No results for input(s): PROBNP in the last 8760 hours.  CBG: No results for input(s): GLUCAP in the last 168 hours.  Recent Results (from the past 240 hour(s))  MRSA PCR Screening     Status: None   Collection Time: 10/11/2015  6:48 PM  Result Value Ref Range Status   MRSA by PCR NEGATIVE NEGATIVE Final    Comment:        The GeneXpert MRSA Assay (FDA approved for NASAL specimens only), is one component of a comprehensive MRSA colonization surveillance program. It is not intended to diagnose MRSA infection nor to guide or monitor treatment for MRSA infections.      Studies: Ct Angio Chest Pe W/cm &/or Wo Cm  10/25/2015  CLINICAL DATA:  Shortness of breath, tachycardia, atrial fibrillation, former smoker, history cholangiocarcinoma EXAM: CT ANGIOGRAPHY CHEST WITH CONTRAST TECHNIQUE: Multidetector CT imaging of the chest was performed using the standard protocol during bolus administration of intravenous contrast. Multiplanar CT image reconstructions and MIPs were obtained to evaluate the vascular anatomy. CONTRAST:  168mL OMNIPAQUE IOHEXOL 350 MG/ML SOLN IV COMPARISON:  02/26/2015 FINDINGS: Aneurysmal dilatation ascending thoracic aorta 4.5 cm transverse image 41  previously 4.3 cm. Scattered atherosclerotic calcifications aorta and coronary arteries. Enlargement of cardiac chambers particularly atria. Numerous small BILATERAL pulmonary arterial filling defects throughout all lobes consistent with pulmonary emboli, including small saddle emboli at the RIGHT upper and RIGHT lower lobes an within LEFT lower lobe branches. Elevated RV/LV ratio = 1.25 consistent with RIGHT heart strain. Enhancing nodule within the mid thoracic esophagus on the previous exam no longer identified though the mid the esophagus does demonstrate mild wall thickening. No thoracic adenopathy. Visualized upper abdomen significant for mild  ascites. Small BILATERAL pleural effusions RIGHT greater than LEFT. Atelectasis RIGHT lower lobe. Numerous new nodular foci in both lungs as well as a focus of questionable nodularity versus infiltrate in the anterior RIGHT upper lobe 13 mm diameter image 43. Segmental bronchiectasis anterior basal RIGHT lower lobe. No infiltrate or pneumothorax. No acute osseous findings. Review of the MIP images confirms the above findings. IMPRESSION: Multiple BILATERAL pulmonary emboli. Positive for acute PE with CT evidence of right heart strain (RV/LV Ratio = 1.25) consistent with at least submassive (intermediate risk) PE. The presence of right heart strain has been associated with an increased risk of morbidity and mortality. Please activate Code PE by paging [phone]. Enlargement of cardiac chambers particularly both atria. Aneurysmal dilatation ascending thoracic aorta, recommendation below. Ascending thoracic aortic aneurysm. Recommend semi-annual imaging followup by CTA or MRA and referral to cardiothoracic surgery if not already obtained. This recommendation follows 2010 ACCF/AHA/AATS/ACR/ASA/SCA/SCAI/SIR/STS/SVM Guidelines for the Diagnosis and Management of Patients With Thoracic Aortic Disease. Circulation. 2010; 121: O841-Y606 New multiple BILATERAL pulmonary nodules suspicious for metastatic disease in this patient with a history of cholangiocarcinoma. Small BILATERAL pleural effusions with upper abdominal ascites noted. Critical Value/emergent results were called by telephone at the time of interpretation on 10/11/2015 at 1644 hours to Dr. Leo Grosser , who verbally acknowledged these results. Electronically Signed   By: Lavonia Dana M.D.   On: 10/10/2015 16:44   Brandon Dean  10/25/2015  CLINICAL DATA:  Suspected cholangiocarcinoma, elevated CA 19-9 EXAM: MRI ABDOMEN WITHOUT AND WITH CONTRAST (INCLUDING MRCP) TECHNIQUE: Multiplanar multisequence Brandon imaging of the abdomen was performed both  before and after the administration of intravenous contrast. Heavily T2-weighted images of the biliary and pancreatic ducts were obtained, and three-dimensional MRCP images were rendered by post processing. CONTRAST:  58mL MULTIHANCE GADOBENATE DIMEGLUMINE 529 MG/ML IV SOLN COMPARISON:  CT abdomen pelvis dated 10/21/2015 and 02/26/2015 FINDINGS: Severely motion degraded images. Lower chest: Small bilateral pleural effusions, right greater than left. Cardiomegaly. Hepatobiliary: Focal biliary dilatation in the lateral segment left hepatic lobe (series 10/image 24; series 1104/image 40), progressive from 02/2015, worrisome for underlying intrahepatic cholangiocarcinoma. Numerous rim enhancing lesions throughout both lobes, measuring up to 11 mm (series 1104/image 30), new from 02/2015, compatible with metastases. Distended gallbladder with multiple layering gallstones. Dilated common duct, measuring 12 mm (series 400/ image 82). At least one 10 mm mid/distal CBD stone is present (series 3/ image 39). Pancreas: Within normal limits. Spleen: Within normal limits. Adrenals/Urinary Tract: Adrenal glands are within normal limits. Multiple bilateral renal cysts measuring up to 8.1 cm in the anterior right lower kidney (series 10/ image 41). Hemorrhagic left renal cysts measuring up to 1.7 cm (series 10/ image 35). No hydronephrosis. Stomach/Bowel: Stomach and visualized bowel are grossly unremarkable. Vascular/Lymphatic: No evidence of abdominal aortic aneurysm. Suspected upper abdominal/retroperitoneal lymph nodes are better evaluated on CT due to motion degradation. Other: Small volume abdominal ascites. Musculoskeletal: No focal osseous lesions. IMPRESSION:  Severely motion degraded images. Progressive focal intrahepatic biliary dilation in the lateral segment left hepatic lobe, worrisome for intrahepatic cholangiocarcinoma. Numerous new rim enhancing lesions throughout both lobes, measuring up to 11 mm, compatible with  metastases. Suspected upper abdominal/retroperitoneal lymph nodes are better evaluated on recent CT due to motion degradation. Cholelithiasis with choledocholithiasis. Common duct measures 11 mm and is notable for at least one mid/distal CBD stone measuring 10 mm. Additional ancillary findings as above. Electronically Signed   By: Julian Hy M.D.   On: 10/25/2015 13:39   Brandon Abd W/wo Cm/mrcp  10/25/2015  CLINICAL DATA:  Suspected cholangiocarcinoma, elevated CA 19-9 EXAM: MRI ABDOMEN WITHOUT AND WITH CONTRAST (INCLUDING MRCP) TECHNIQUE: Multiplanar multisequence Brandon imaging of the abdomen was performed both before and after the administration of intravenous contrast. Heavily T2-weighted images of the biliary and pancreatic ducts were obtained, and three-dimensional MRCP images were rendered by post processing. CONTRAST:  34mL MULTIHANCE GADOBENATE DIMEGLUMINE 529 MG/ML IV SOLN COMPARISON:  CT abdomen pelvis dated 10/21/2015 and 02/26/2015 FINDINGS: Severely motion degraded images. Lower chest: Small bilateral pleural effusions, right greater than left. Cardiomegaly. Hepatobiliary: Focal biliary dilatation in the lateral segment left hepatic lobe (series 10/image 24; series 1104/image 40), progressive from 02/2015, worrisome for underlying intrahepatic cholangiocarcinoma. Numerous rim enhancing lesions throughout both lobes, measuring up to 11 mm (series 1104/image 30), new from 02/2015, compatible with metastases. Distended gallbladder with multiple layering gallstones. Dilated common duct, measuring 12 mm (series 400/ image 82). At least one 10 mm mid/distal CBD stone is present (series 3/ image 39). Pancreas: Within normal limits. Spleen: Within normal limits. Adrenals/Urinary Tract: Adrenal glands are within normal limits. Multiple bilateral renal cysts measuring up to 8.1 cm in the anterior right lower kidney (series 10/ image 41). Hemorrhagic left renal cysts measuring up to 1.7 cm (series 10/ image  35). No hydronephrosis. Stomach/Bowel: Stomach and visualized bowel are grossly unremarkable. Vascular/Lymphatic: No evidence of abdominal aortic aneurysm. Suspected upper abdominal/retroperitoneal lymph nodes are better evaluated on CT due to motion degradation. Other: Small volume abdominal ascites. Musculoskeletal: No focal osseous lesions. IMPRESSION: Severely motion degraded images. Progressive focal intrahepatic biliary dilation in the lateral segment left hepatic lobe, worrisome for intrahepatic cholangiocarcinoma. Numerous new rim enhancing lesions throughout both lobes, measuring up to 11 mm, compatible with metastases. Suspected upper abdominal/retroperitoneal lymph nodes are better evaluated on recent CT due to motion degradation. Cholelithiasis with choledocholithiasis. Common duct measures 11 mm and is notable for at least one mid/distal CBD stone measuring 10 mm. Additional ancillary findings as above. Electronically Signed   By: Julian Hy M.D.   On: 10/25/2015 13:39    Scheduled Meds: . enoxaparin (LOVENOX) injection  1 mg/kg Subcutaneous Q12H  . LORazepam  0.5 mg Intravenous Once  . metoprolol succinate  100 mg Oral Daily  . sodium chloride  3 mL Intravenous Q12H  . sodium chloride  3 mL Intravenous Q12H   Continuous Infusions: . sodium chloride 75 mL/hr at 10/25/15 1004    Principal Problem:   Pulmonary embolism ([place]) Active Problems:   Cholangiocarcinoma ([place])   HTN (hypertension)   Dyslipidemia    Time spent: 30 min    Kelvin Cellar  [facility] Pager [phone]. If 7PM-7AM, please contact night-coverage at [email or web address], password [facility] 10/26/2015, 12:29 PM  LOS: 2 days

## 2015-10-26 NOTE — H&P (Signed)
Chief Complaint: Liver masses  Referring Physician(s): Dr. Carlean Purl  History of Present Illness: Brandon Dean is a 79 y.o. male who had a CT scan of abdomen and pelvis that was performed on 10/21/2015 that showed abnormal enhancement along the walls of the distended common bile duct that were highly suspicious for cholangiocarcinoma.   This was further worked up with an MRCP which have demonstrated findings concerning for cholangiocarcinoma with liver metastasis.  We are asked to evaluate patient for US guided biopsy of the liver masses.  Other pertinent history is chronic anticoagulation with Eliquis for Afib. Last dose was Friday evening per his wife.  Pulmonary embolism now on Lovenox  Past Medical History  Diagnosis Date  . Persistent atrial fibrillation ([place])   . Ascending aortic aneurysm ([place])   . MVP (mitral valve prolapse)   . Mild aortic insufficiency   . Systemic hypertension   . Permanent atrial fibrillation ([place]) 01/24/2015  . Inguinal hernia 1980s.     Past Surgical History  Procedure Laterality Date  . Orif clavicle fracture  1946    left  . Carpal tunnel release  2010    left  . US echocardiography  03/08/2012    severe left atrial dilatation, moderate right atrial dilatation,mild aortic sclerosis,mild AI, mild bileaflet MVP,trace MR,mild aortic root dilatation.  . Right finger mass Right 2011    removed, benign.     Allergies: Pradaxa and Xarelto  Medications: Prior to Admission medications   Medication Sig Start Date End Date Taking? Authorizing Provider  atorvastatin (LIPITOR) 10 MG tablet Take 1 tablet by mouth daily. 10/11/15  Yes Historical Provider, MD  Cholecalciferol (VITAMIN D) 2000 UNITS tablet Take 4,000 Units by mouth daily.    Yes Historical Provider, MD  ELIQUIS 5 MG TABS tablet Take 5 mg by mouth 2 (two) times daily.  01/18/15  Yes Historical Provider, MD  folic acid (FOLVITE) 631 MCG tablet Take 400 mcg by mouth daily.    Yes  Historical Provider, MD  metoprolol succinate (TOPROL-XL) 50 MG 24 hr tablet Take 50 mg by mouth daily. Take with or immediately following a meal.   Yes Historical Provider, MD  Omega-3 Fatty Acids (FISH OIL PO) Take 1 tablet by mouth daily.   Yes Historical Provider, MD  traMADol (ULTRAM) 50 MG tablet Take 1-2 tablets by mouth every 6 (six) hours as needed. Pain. 10/01/15  Yes Historical Provider, MD  ciprofloxacin (CIPRO) 500 MG tablet Take 1 tablet (500 mg total) by mouth 2 (two) times daily. 10/06/2015   Milus Banister, MD     Family History  Problem Relation Age of Onset  . Cancer Mother     breast  . Heart attack Father   . Heart failure Brother   . Hypertension Sister     Social History   Social History  . Marital Status: Married    Spouse Name: N/A  . Number of Children: N/A  . Years of Education: N/A   Occupational History  . chemist    Social History Main Topics  . Smoking status: Former Smoker    Quit date: 12/26/1966  . Smokeless tobacco: None  . Alcohol Use: 0.0 oz/week    0 Standard drinks or equivalent per week     Comment: 2 glasses wine daily  . Drug Use: No  . Sexual Activity: Not Asked   Other Topics Concern  . None   Social History Narrative   Retired English as a second language teacher.  Born in [place]. Married  to his second wife (Dr Mabry's step mother)     Review of Systems  Constitutional: Positive for activity change. Negative for fever, chills and appetite change.  HENT: Negative.   Respiratory: Positive for shortness of breath. Negative for cough.   Cardiovascular: Negative for chest pain.  Gastrointestinal: Positive for nausea and abdominal pain. Negative for abdominal distention.  Musculoskeletal: Negative.   Skin: Negative.   Neurological: Negative.   Psychiatric/Behavioral: Negative.     Vital Signs: BP 146/78 mmHg  Pulse 100  Temp(Src) 98.2 F (36.8 C) (Oral)  Resp 16  Ht 5\' 10"  (1.778 m)  Wt 178 lb 5.6 oz (80.9 kg)  BMI 25.59 kg/m2  SpO2  98%  Physical Exam  Constitutional: He is oriented to person, place, and time. He appears well-developed and well-nourished.  HENT:  Head: Normocephalic and atraumatic.  Eyes: EOM are normal.  Neck: Normal range of motion. Neck supple.  Cardiovascular: Normal rate, regular rhythm and normal heart sounds.   Pulmonary/Chest: Effort normal and breath sounds normal.  Abdominal: Soft. Bowel sounds are normal. He exhibits no distension.  Musculoskeletal: Normal range of motion.  Neurological: He is alert and oriented to person, place, and time.  Skin: Skin is warm and dry.  Psychiatric: He has a normal mood and affect. His behavior is normal. Judgment and thought content normal.  Vitals reviewed.   Mallampati Score:  MD Evaluation Airway: WNL Heart: WNL Abdomen: WNL Chest/ Lungs: WNL ASA  Classification: 3 Mallampati/Airway Score: One  Imaging: Ct Abdomen Pelvis W Wo Contrast  10/21/2015  ADDENDUM REPORT: 10/21/2015 19:41 ADDENDUM: clinical data should read "Abnormal appearance of the liver identified on previous CT." Electronically Signed   By: Franki Cabot M.D.   On: 10/21/2015 19:41  10/21/2015  CLINICAL DATA:  Known hernia increasing in size. Left-sided pain for 2 months. Abnormal clearance of the liver identified on previous CT. Subsequent abdominal MRI unable to be performed for some reason. 16 mm lesion within the left kidney also described on earlier CT. EXAM: CT ABDOMEN AND PELVIS WITHOUT AND WITH CONTRAST TECHNIQUE: Multidetector CT imaging of the abdomen and pelvis was performed following the standard protocol before and following the bolus administration of intravenous contrast. CONTRAST:  197mL ISOVUE-300 IOPAMIDOL (ISOVUE-300) INJECTION 61% COMPARISON:  CTA chest and abdomen dated 02/26/2015. FINDINGS: There is abnormal enhancement along the walls of the dilated common bile duct, best seen on images 48 through 54 of series 4, suspicious for cholangiocarcinoma. There is also  confluent bile duct dilatation throughout the majority of the left hepatic lobe. Hypodense mass within the far medial margin of the left hepatic lobe measures 3.3 x 2.4 cm but demonstrates no significant enhancement and is presumably a confluence of dilated bile ducts or complex cyst. There are, however, numerous smaller hypodense lesions throughout the periphery of both hepatic lobes highly suggestive of metastatic disease. Gallbladder is moderately distended but otherwise unremarkable. Pancreas, spleen, and adrenal glands appear normal. There are multiple bilateral renal cysts, including several hyperdense hemorrhagic cysts. Largest cyst is within the right kidney measuring 8.8 cm. No renal stone or hydronephrosis. Small amount of free fluid seen within the upper abdomen extending into the bilateral pericolic gutter regions. Additional small amount of free fluid noted within the lower pelvis. There is a large left inguinal hernia which contains small bowel but there is no evidence of bowel obstruction or inflammation within the hernia sac. No dilated small or large bowel loops. Scattered small and moderate -sized lymph nodes are  seen within the abdomen and retroperitoneum. Atherosclerotic changes are seen along the walls of the normal-caliber abdominal aorta. There are small pleural effusions at each lung base. Multiple pulmonary nodules are seen at the right lung base including 9 mm and 8 mm pulmonary nodules seen on images 20 and 21 respectively of series 4. Additional scarring/fibrosis noted each lung base. Degenerative changes are seen throughout the thoracolumbar spine but no acute osseous abnormality and no evidence of osseous metastasis. IMPRESSION: 1. Abnormal enhancement along the walls of the distended common bile duct highly suspicious for cholangiocarcinoma. Recommend ERCP or MRCP for further characterization. 2. Prominent bile duct dilatation within the left hepatic lobe, further evidence for a  central cholangiocarcinoma. 3. Numerous small hypodense lesions throughout the periphery of the bilateral liver lobes, highly suspicious for metastatic disease. An additional mass within the far medial aspects of the left hepatic lobe, measuring 3.3 x 2.4 cm, shows no evidence of significant enhancement and may merely represent a confluence of dilated bile ducts or complex cyst. 4. Multiple pulmonary nodules at each lung base, right greater than left, highly suspicious for pulmonary metastases. Additional small pleural effusions seen bilaterally. 5. Small amount of free fluid within the abdomen and pelvis. 6. Large left inguinal hernia which contains multiple small bowel loops, extending to the left scrotum. No evidence of associated bowel obstruction or inflammation within this large hernia sac. 7. Numerous renal cysts bilaterally, some of which are hemorrhagic cysts. No suspicious enhancing renal mass. 8. Scattered small and moderate-sized lymph nodes within the upper abdomen and retroperitoneum, suspicious for metastatic lymphadenopathy. These results will be called to the ordering clinician or representative by the Radiologist Assistant, and communication documented in the PACS or zVision Dashboard. Electronically Signed: By: Franki Cabot M.D. On: 10/21/2015 19:10   Ct Angio Chest Pe W/cm &/or Wo Cm  10/20/2015  CLINICAL DATA:  Shortness of breath, tachycardia, atrial fibrillation, former smoker, history cholangiocarcinoma EXAM: CT ANGIOGRAPHY CHEST WITH CONTRAST TECHNIQUE: Multidetector CT imaging of the chest was performed using the standard protocol during bolus administration of intravenous contrast. Multiplanar CT image reconstructions and MIPs were obtained to evaluate the vascular anatomy. CONTRAST:  134mL OMNIPAQUE IOHEXOL 350 MG/ML SOLN IV COMPARISON:  02/26/2015 FINDINGS: Aneurysmal dilatation ascending thoracic aorta 4.5 cm transverse image 41 previously 4.3 cm. Scattered atherosclerotic  calcifications aorta and coronary arteries. Enlargement of cardiac chambers particularly atria. Numerous small BILATERAL pulmonary arterial filling defects throughout all lobes consistent with pulmonary emboli, including small saddle emboli at the RIGHT upper and RIGHT lower lobes an within LEFT lower lobe branches. Elevated RV/LV ratio = 1.25 consistent with RIGHT heart strain. Enhancing nodule within the mid thoracic esophagus on the previous exam no longer identified though the mid the esophagus does demonstrate mild wall thickening. No thoracic adenopathy. Visualized upper abdomen significant for mild ascites. Small BILATERAL pleural effusions RIGHT greater than LEFT. Atelectasis RIGHT lower lobe. Numerous new nodular foci in both lungs as well as a focus of questionable nodularity versus infiltrate in the anterior RIGHT upper lobe 13 mm diameter image 43. Segmental bronchiectasis anterior basal RIGHT lower lobe. No infiltrate or pneumothorax. No acute osseous findings. Review of the MIP images confirms the above findings. IMPRESSION: Multiple BILATERAL pulmonary emboli. Positive for acute PE with CT evidence of right heart strain (RV/LV Ratio = 1.25) consistent with at least submassive (intermediate risk) PE. The presence of right heart strain has been associated with an increased risk of morbidity and mortality. Please activate Code PE by  paging [phone]. Enlargement of cardiac chambers particularly both atria. Aneurysmal dilatation ascending thoracic aorta, recommendation below. Ascending thoracic aortic aneurysm. Recommend semi-annual imaging followup by CTA or MRA and referral to cardiothoracic surgery if not already obtained. This recommendation follows 2010 ACCF/AHA/AATS/ACR/ASA/SCA/SCAI/SIR/STS/SVM Guidelines for the Diagnosis and Management of Patients With Thoracic Aortic Disease. Circulation. 2010; 121: A193-X902 New multiple BILATERAL pulmonary nodules suspicious for metastatic disease in this  patient with a history of cholangiocarcinoma. Small BILATERAL pleural effusions with upper abdominal ascites noted. Critical Value/emergent results were called by telephone at the time of interpretation on 10/19/2015 at 1644 hours to Dr. Leo Grosser , who verbally acknowledged these results. Electronically Signed   By: Lavonia Dana M.D.   On: 10/04/2015 16:44   Mr 3d Recon At Scanner  10/25/2015  CLINICAL DATA:  Suspected cholangiocarcinoma, elevated CA 19-9 EXAM: MRI ABDOMEN WITHOUT AND WITH CONTRAST (INCLUDING MRCP) TECHNIQUE: Multiplanar multisequence MR imaging of the abdomen was performed both before and after the administration of intravenous contrast. Heavily T2-weighted images of the biliary and pancreatic ducts were obtained, and three-dimensional MRCP images were rendered by post processing. CONTRAST:  54mL MULTIHANCE GADOBENATE DIMEGLUMINE 529 MG/ML IV SOLN COMPARISON:  CT abdomen pelvis dated 10/21/2015 and 02/26/2015 FINDINGS: Severely motion degraded images. Lower chest: Small bilateral pleural effusions, right greater than left. Cardiomegaly. Hepatobiliary: Focal biliary dilatation in the lateral segment left hepatic lobe (series 10/image 24; series 1104/image 40), progressive from 02/2015, worrisome for underlying intrahepatic cholangiocarcinoma. Numerous rim enhancing lesions throughout both lobes, measuring up to 11 mm (series 1104/image 30), new from 02/2015, compatible with metastases. Distended gallbladder with multiple layering gallstones. Dilated common duct, measuring 12 mm (series 400/ image 82). At least one 10 mm mid/distal CBD stone is present (series 3/ image 39). Pancreas: Within normal limits. Spleen: Within normal limits. Adrenals/Urinary Tract: Adrenal glands are within normal limits. Multiple bilateral renal cysts measuring up to 8.1 cm in the anterior right lower kidney (series 10/ image 41). Hemorrhagic left renal cysts measuring up to 1.7 cm (series 10/ image 35). No  hydronephrosis. Stomach/Bowel: Stomach and visualized bowel are grossly unremarkable. Vascular/Lymphatic: No evidence of abdominal aortic aneurysm. Suspected upper abdominal/retroperitoneal lymph nodes are better evaluated on CT due to motion degradation. Other: Small volume abdominal ascites. Musculoskeletal: No focal osseous lesions. IMPRESSION: Severely motion degraded images. Progressive focal intrahepatic biliary dilation in the lateral segment left hepatic lobe, worrisome for intrahepatic cholangiocarcinoma. Numerous new rim enhancing lesions throughout both lobes, measuring up to 11 mm, compatible with metastases. Suspected upper abdominal/retroperitoneal lymph nodes are better evaluated on recent CT due to motion degradation. Cholelithiasis with choledocholithiasis. Common duct measures 11 mm and is notable for at least one mid/distal CBD stone measuring 10 mm. Additional ancillary findings as above. Electronically Signed   By: Julian Hy M.D.   On: 10/25/2015 13:39   Mr Abd W/wo Cm/mrcp  10/25/2015  CLINICAL DATA:  Suspected cholangiocarcinoma, elevated CA 19-9 EXAM: MRI ABDOMEN WITHOUT AND WITH CONTRAST (INCLUDING MRCP) TECHNIQUE: Multiplanar multisequence MR imaging of the abdomen was performed both before and after the administration of intravenous contrast. Heavily T2-weighted images of the biliary and pancreatic ducts were obtained, and three-dimensional MRCP images were rendered by post processing. CONTRAST:  76mL MULTIHANCE GADOBENATE DIMEGLUMINE 529 MG/ML IV SOLN COMPARISON:  CT abdomen pelvis dated 10/21/2015 and 02/26/2015 FINDINGS: Severely motion degraded images. Lower chest: Small bilateral pleural effusions, right greater than left. Cardiomegaly. Hepatobiliary: Focal biliary dilatation in the lateral segment left hepatic lobe (series 10/image 24; series  1104/image 40), progressive from 02/2015, worrisome for underlying intrahepatic cholangiocarcinoma. Numerous rim enhancing lesions  throughout both lobes, measuring up to 11 mm (series 1104/image 30), new from 02/2015, compatible with metastases. Distended gallbladder with multiple layering gallstones. Dilated common duct, measuring 12 mm (series 400/ image 82). At least one 10 mm mid/distal CBD stone is present (series 3/ image 39). Pancreas: Within normal limits. Spleen: Within normal limits. Adrenals/Urinary Tract: Adrenal glands are within normal limits. Multiple bilateral renal cysts measuring up to 8.1 cm in the anterior right lower kidney (series 10/ image 41). Hemorrhagic left renal cysts measuring up to 1.7 cm (series 10/ image 35). No hydronephrosis. Stomach/Bowel: Stomach and visualized bowel are grossly unremarkable. Vascular/Lymphatic: No evidence of abdominal aortic aneurysm. Suspected upper abdominal/retroperitoneal lymph nodes are better evaluated on CT due to motion degradation. Other: Small volume abdominal ascites. Musculoskeletal: No focal osseous lesions. IMPRESSION: Severely motion degraded images. Progressive focal intrahepatic biliary dilation in the lateral segment left hepatic lobe, worrisome for intrahepatic cholangiocarcinoma. Numerous new rim enhancing lesions throughout both lobes, measuring up to 11 mm, compatible with metastases. Suspected upper abdominal/retroperitoneal lymph nodes are better evaluated on recent CT due to motion degradation. Cholelithiasis with choledocholithiasis. Common duct measures 11 mm and is notable for at least one mid/distal CBD stone measuring 10 mm. Additional ancillary findings as above. Electronically Signed   By: Julian Hy M.D.   On: 10/25/2015 13:39    Labs:  CBC:  Recent Labs  10/23/15 1225 10/03/2015 1454 10/25/15 0303 10/26/15 0445  WBC 13.3* 12.8* 12.1* 12.6*  HGB 10.7* 9.8* 9.2* 9.2*  HCT 32.2* 29.5* 28.4* 28.7*  PLT 51.0 Repeated and verified X2.* 54* 76* 104*    COAGS:  Recent Labs  10/23/15 1225 10/22/2015 1644 10/25/15 0303 10/25/15 1059  INR  2.2* 2.32*  --   --   APTT  --  44* 81* 58*    BMP:  Recent Labs  10/23/15 1225 10/01/2015 1454 10/25/15 0303 10/26/15 0445  NA 133* 133* 135 136  K 4.2 4.1 4.0 3.9  CL 101 103 103 105  CO2 23 21* 24 25  GLUCOSE 118* 115* 111* 127*  BUN 34* 35* 36* 29*  CALCIUM 8.8 8.7* 8.4* 8.3*  CREATININE 0.75 0.72 0.76 0.72  GFRNONAA  --  >60 >60 >60  GFRAA  --  >60 >60 >60    LIVER FUNCTION TESTS:  Recent Labs  10/23/15 1225 10/09/2015 1454 10/25/15 0303  BILITOT 1.5* 1.4* 1.0  AST 38* 47* 35  ALT 28 30 26   ALKPHOS 420* 440* 401*  PROT 6.3 6.2* 5.8*  ALBUMIN 3.1* 3.0* 2.7*    TUMOR MARKERS:  Recent Labs  10/23/15 1225  CA199 690.3*    Assessment and Plan:  Liver masses, pulmonary lesions, left main hepatic duct stricture with elevated CA 19-9 which GI feels could be metastatic cholangiocarcinoma  Pulmonary emboli on Lovenox (hold tomorrow am dose)  Thrombocytopenia - up to 100k  Will proceed with US guided liver biopsy by Dr. Pascal Lux on 10/27/2015  Risks and Benefits discussed with the patient including, but not limited to bleeding, infection, damage to adjacent structures or low yield requiring additional tests.  All of the patient's questions were answered, patient is agreeable to proceed. Consent signed and in chart.   Thank you for this interesting consult.  I greatly enjoyed meeting Kendarius Vigen and look forward to participating in their care.  A copy of this report was sent to the requesting provider on this date.  Signed: Murrell Redden PA-C 10/26/2015, 1:13 PM   I spent a total of 40 Minutes   in face to face in clinical consultation, greater than 50% of which was counseling/coordinating care for US guided liver biopsy

## 2015-10-26 NOTE — Progress Notes (Signed)
   Patient Name: Brandon Dean Date of Encounter: 10/26/2015, 11:22 AM    Subjective  Has pain from scrotal hernia and left neck is painful    Objective  BP 146/78 mmHg  Pulse 100  Temp(Src) 98.2 F (36.8 C) (Oral)  Resp 16  Ht 5\' 10"  (1.778 m)  Wt 178 lb 5.6 oz (80.9 kg)  BMI 25.59 kg/m2  SpO2 98% NAD but a bit unconfortable   MRI/MRCP 10/29 Severely motion degraded images.  Progressive focal intrahepatic biliary dilation in the lateral segment left hepatic lobe, worrisome for intrahepatic cholangiocarcinoma.  Numerous new rim enhancing lesions throughout both lobes, measuring up to 11 mm, compatible with metastases.  Suspected upper abdominal/retroperitoneal lymph nodes are better evaluated on recent CT due to motion degradation.  Cholelithiasis with choledocholithiasis. Common duct measures 11 mm and is notable for at least one mid/distal CBD stone measuring 10 mm.   Assessment and Plan  Liver masses, pulmonary lesions, left main hepatic duct stricture with elevated CA 19-9 which we think is metastatic cholangiocarcinoma  Pulmonary emboli on Lovenox  Thrombocytopenia - up to 100k   +++++++++++++++++++    I reviewed things with radiologist and we think US-guided liver bx in left lobe area is most sensible way to get tissue dx at this point. We can consider an ERCP with brushing and perhaps spyglass with bx if this fails vs percutaneous transhepatic cholangiogram with brushing/bx.  The filling defect in CBD has an atypical appearance for a stone - its more linear and he is not acting like a stone is there - could have been a blood clot?  Though it says 11 mm max diam liver lesions it seems like there is 3 cm left lobe lesion on our review (target for bx).  Will hold 10/31 Lovenox in anticipation of bx.   Explained to patient  Gatha Mayer, MD, [facility] [phone] (pager) 10/26/2015 11:29 AM

## 2015-10-26 NOTE — Plan of Care (Signed)
Problem: Phase II Progression Outcomes Goal: Obtain order to discontinue catheter if appropriate Outcome: Not Applicable Date Met:  94/09/05 No foley, patient voiding.

## 2015-10-27 ENCOUNTER — Encounter (HOSPITAL_COMMUNITY): Payer: Self-pay | Admitting: General Surgery

## 2015-10-27 ENCOUNTER — Inpatient Hospital Stay (HOSPITAL_COMMUNITY): Payer: Medicare Other

## 2015-10-27 ENCOUNTER — Ambulatory Visit (HOSPITAL_COMMUNITY): Admission: RE | Admit: 2015-10-27 | Payer: Medicare Other | Source: Ambulatory Visit

## 2015-10-27 DIAGNOSIS — I2699 Other pulmonary embolism without acute cor pulmonale: Secondary | ICD-10-CM

## 2015-10-27 DIAGNOSIS — C221 Intrahepatic bile duct carcinoma: Secondary | ICD-10-CM

## 2015-10-27 DIAGNOSIS — I63411 Cerebral infarction due to embolism of right middle cerebral artery: Secondary | ICD-10-CM

## 2015-10-27 LAB — COMPREHENSIVE METABOLIC PANEL
ALK PHOS: 428 U/L — AB (ref 38–126)
ALT: 25 U/L (ref 17–63)
ALT: 40 U/L (ref 17–63)
ANION GAP: 9 (ref 5–15)
AST: 35 U/L (ref 15–41)
AST: 74 U/L — ABNORMAL HIGH (ref 15–41)
Albumin: 2.8 g/dL — ABNORMAL LOW (ref 3.5–5.0)
Albumin: 2.6 g/dL — ABNORMAL LOW (ref 3.5–5.0)
Alkaline Phosphatase: 386 U/L — ABNORMAL HIGH (ref 38–126)
Anion gap: 6 (ref 5–15)
BILIRUBIN TOTAL: 4.2 mg/dL — AB (ref 0.3–1.2)
BUN: 26 mg/dL — AB (ref 6–20)
BUN: 31 mg/dL — ABNORMAL HIGH (ref 6–20)
CALCIUM: 8.1 mg/dL — AB (ref 8.9–10.3)
CHLORIDE: 105 mmol/L (ref 101–111)
CO2: 24 mmol/L (ref 22–32)
CO2: 22 mmol/L (ref 22–32)
Calcium: 8.3 mg/dL — ABNORMAL LOW (ref 8.9–10.3)
Chloride: 107 mmol/L (ref 101–111)
Creatinine, Ser: 0.7 mg/dL (ref 0.61–1.24)
Creatinine, Ser: 0.64 mg/dL (ref 0.61–1.24)
GFR calc non Af Amer: >60 mL/min (ref >60)
GLUCOSE: 115 mg/dL — AB (ref 65–99)
Glucose, Bld: 130 mg/dL — ABNORMAL HIGH (ref 65–99)
POTASSIUM: 3.7 mmol/L (ref 3.5–5.1)
Potassium: 4.1 mmol/L (ref 3.5–5.1)
SODIUM: 135 mmol/L (ref 135–145)
SODIUM: 138 mmol/L (ref 135–145)
TOTAL PROTEIN: 5.4 g/dL — AB (ref 6.5–8.1)
Total Bilirubin: 1.5 mg/dL — ABNORMAL HIGH (ref 0.3–1.2)
Total Protein: 6 g/dL — ABNORMAL LOW (ref 6.5–8.1)

## 2015-10-27 LAB — CBC
HCT: 26.3 % — ABNORMAL LOW (ref 39.0–52.0)
HEMATOCRIT: 28.9 % — AB (ref 39.0–52.0)
HEMOGLOBIN: 9.4 g/dL — AB (ref 13.0–17.0)
Hemoglobin: 8.6 g/dL — ABNORMAL LOW (ref 13.0–17.0)
MCH: 27.2 pg (ref 26.0–34.0)
MCH: 27.5 pg (ref 26.0–34.0)
MCHC: 32.5 g/dL (ref 30.0–36.0)
MCHC: 32.7 g/dL (ref 30.0–36.0)
MCV: 83.8 fL (ref 78.0–100.0)
MCV: 84 fL (ref 78.0–100.0)
Platelets: 109 10*3/uL — ABNORMAL LOW (ref 150–400)
Platelets: 79 10*3/uL — ABNORMAL LOW (ref 150–400)
RBC: 3.45 MIL/uL — ABNORMAL LOW (ref 4.22–5.81)
RBC: 3.13 MIL/uL — AB (ref 4.22–5.81)
RDW: 15.6 % — AB (ref 11.5–15.5)
RDW: 16.1 % — ABNORMAL HIGH (ref 11.5–15.5)
WBC: 14 10*3/uL — ABNORMAL HIGH (ref 4.0–10.5)
WBC: 17 10*3/uL — AB (ref 4.0–10.5)

## 2015-10-27 LAB — BLOOD GAS, ARTERIAL
ACID-BASE DEFICIT: 3.4 mmol/L — AB (ref 0.0–2.0)
Bicarbonate: 17.7 mEq/L — ABNORMAL LOW (ref 20.0–24.0)
DRAWN BY: 232811
FIO2: 0.55
O2 Saturation: 99.3 %
PATIENT TEMPERATURE: 99.7
PCO2 ART: 20.5 mmHg — AB (ref 35.0–45.0)
TCO2: 16.3 mmol/L (ref 0–100)
pH, Arterial: 7.548 — ABNORMAL HIGH (ref 7.350–7.450)
pO2, Arterial: 143 mmHg — ABNORMAL HIGH (ref 80.0–100.0)

## 2015-10-27 LAB — PROTIME-INR
INR: 1.66 — ABNORMAL HIGH (ref 0.00–1.49)
Prothrombin Time: 19.6 seconds — ABNORMAL HIGH (ref 11.6–15.2)

## 2015-10-27 LAB — MAGNESIUM: MAGNESIUM: 1.4 mg/dL — AB (ref 1.7–2.4)

## 2015-10-27 LAB — LACTIC ACID, PLASMA: Lactic Acid, Venous: 1.9 mmol/L (ref 0.5–2.0)

## 2015-10-27 LAB — TROPONIN I: TROPONIN I: 0.43 ng/mL — AB (ref <0.031)

## 2015-10-27 MED ORDER — DILTIAZEM HCL 100 MG IV SOLR
INTRAVENOUS | Status: AC
  Filled 2015-10-27: qty 100

## 2015-10-27 MED ORDER — DEXTROSE 5 % IV SOLN
5.0000 mg/h | INTRAVENOUS | Status: DC
  Administered 2015-10-27: 5 mg/h via INTRAVENOUS

## 2015-10-27 MED ORDER — DILTIAZEM LOAD VIA INFUSION
10.0000 mg | Freq: Once | INTRAVENOUS | Status: AC
  Administered 2015-10-27: 10 mg via INTRAVENOUS
  Filled 2015-10-27: qty 10

## 2015-10-27 MED ORDER — MIDAZOLAM HCL 2 MG/2ML IJ SOLN
INTRAMUSCULAR | Status: AC
  Filled 2015-10-27: qty 6

## 2015-10-27 MED ORDER — SODIUM CHLORIDE 0.9 % IV BOLUS (SEPSIS)
500.0000 mL | Freq: Once | INTRAVENOUS | Status: AC
  Administered 2015-10-27: 500 mL via INTRAVENOUS

## 2015-10-27 MED ORDER — VITAMINS A & D EX OINT
TOPICAL_OINTMENT | CUTANEOUS | Status: AC
  Administered 2015-10-27: 5
  Filled 2015-10-27: qty 5

## 2015-10-27 MED ORDER — FENTANYL CITRATE (PF) 100 MCG/2ML IJ SOLN
INTRAMUSCULAR | Status: AC | PRN
  Administered 2015-10-27: 25 ug via INTRAVENOUS

## 2015-10-27 MED ORDER — FENTANYL CITRATE (PF) 100 MCG/2ML IJ SOLN
INTRAMUSCULAR | Status: AC
  Filled 2015-10-27: qty 4

## 2015-10-27 MED ORDER — HYDRALAZINE HCL 20 MG/ML IJ SOLN
10.0000 mg | Freq: Four times a day (QID) | INTRAMUSCULAR | Status: DC | PRN
  Administered 2015-10-27: 10 mg via INTRAVENOUS
  Filled 2015-10-27 (×2): qty 1

## 2015-10-27 MED ORDER — MIDAZOLAM HCL 2 MG/2ML IJ SOLN
INTRAMUSCULAR | Status: AC | PRN
  Administered 2015-10-27 (×2): 0.5 mg via INTRAVENOUS

## 2015-10-27 MED ORDER — METOPROLOL TARTRATE 1 MG/ML IV SOLN
5.0000 mg | Freq: Once | INTRAVENOUS | Status: AC
  Administered 2015-10-27: 5 mg via INTRAVENOUS

## 2015-10-27 NOTE — Consult Note (Signed)
Reason for Consult: inguinal hernia  Referring Physician: Dr. Florencia Reasons   HPI: Brandon Dean is a 79 year old male with a history of Afib on Eliquis, inguinal hernia and HTN.  He is the father of Dr. Crist Infante.  He was seen by Dr. Dalbert Batman on 10/20/15, but felt he needed more work up regarding AAA, liver mass, questionable esophageal mass and abnormal LFTs.    The patient had a CT of abdomen and pelvis by his PCP the following day which revealed a distended CBD concerning for a cholangiocarcinoma, hepatic lesions, intra-abdominal lymphadenopathy, multiple pulmonary nodules.  He was then seen by Dr. Ardis Hughs and subsequently referred to the ED.   The patient was found to have SOB and tachycardia.  A CT of chest revealed a pulmonary embolism.  The patient was therefore admitted.  Since admission, he has been placed on a heparin drip.  MRCP showed biliary dilatation concerning for cholangiocarcinoma along with liver lesions and lymphadenopathy.  The patient is scheduled for a liver biopsy later today.  CA 19-9  Is 690  His main concern is scrotal pain with radiation to the back.  Again, the hernia has been there since birth according to the patient.  He endorses increased discomfort and it "descending" about 2 months ago.  He admits to a poor appetite, but denies obstructive symptoms such as nausea or vomiting.  He reports constipation, but passing flatus.  He denies distention.  CT of abdomen showed a large hernia containing small bowel without evidence of inflammation or obstruction.  We have been asked to re-evaluate due to ongoing pain.     Past Medical History  Diagnosis Date  . Persistent atrial fibrillation ([place])   . Ascending aortic aneurysm ([place])   . MVP (mitral valve prolapse)   . Mild aortic insufficiency   . Systemic hypertension   . Permanent atrial fibrillation ([place]) 01/24/2015  . Inguinal hernia 1980s.     Past Surgical History  Procedure Laterality Date  . Orif clavicle  fracture  1946    left  . Carpal tunnel release  2010    left  . US echocardiography  03/08/2012    severe left atrial dilatation, moderate right atrial dilatation,mild aortic sclerosis,mild AI, mild bileaflet MVP,trace MR,mild aortic root dilatation.  . Right finger mass Right 2011    removed, benign.     Family History  Problem Relation Age of Onset  . Cancer Mother     breast  . Heart attack Father   . Heart failure Brother   . Hypertension Sister     Social History:  reports that he quit smoking about 48 years ago. He does not have any smokeless tobacco history on file. He reports that he drinks alcohol. He reports that he does not use illicit drugs.  Allergies:  Allergies  Allergen Reactions  . Pradaxa [Dabigatran Etexilate Mesylate]     Caused intolerable reflux sxs.   Alveda Reasons [Rivaroxaban]     Caused bleeding of the gums.     Medications:  Scheduled Meds: . bisacodyl  10 mg Rectal Once  . enoxaparin (LOVENOX) injection  1 mg/kg Subcutaneous Q12H  . lactulose  20 g Oral Once  . LORazepam  0.5 mg Intravenous Once  . metoprolol succinate  100 mg Oral Daily  . polyethylene glycol  17 g Oral Daily  . senna-docusate  1 tablet Oral BID  . sodium chloride  3 mL Intravenous Q12H  . sodium chloride  3 mL Intravenous  Q12H  . vitamin A & D       Continuous Infusions: . sodium chloride 75 mL/hr at 10/27/15 0742   PRN Meds:.sodium chloride, alum & mag hydroxide-simeth, hydrALAZINE, milk and molasses, morphine injection, ondansetron **OR** ondansetron (ZOFRAN) IV, oxyCODONE, sodium chloride   Results for orders placed or performed during the hospital encounter of 09/30/2015 (from the past 48 hour(s))  Basic metabolic panel     Status: Abnormal   Collection Time: 10/26/15  4:45 AM  Result Value Ref Range   Sodium 136 135 - 145 mmol/L   Potassium 3.9 3.5 - 5.1 mmol/L   Chloride 105 101 - 111 mmol/L   CO2 25 22 - 32 mmol/L   Glucose, Bld 127 (H) 65 - 99 mg/dL   BUN 29  (H) 6 - 20 mg/dL   Creatinine, Ser 0.72 0.61 - 1.24 mg/dL   Calcium 8.3 (L) 8.9 - 10.3 mg/dL   GFR calc non Af Amer >60 >60 mL/min   GFR calc Af Amer >60 >60 mL/min    Comment: (NOTE) The eGFR has been calculated using the CKD EPI equation. This calculation has not been validated in all clinical situations. eGFR's persistently <60 mL/min signify possible Chronic Kidney Disease.    Anion gap 6 5 - 15  CBC     Status: Abnormal   Collection Time: 10/26/15  4:45 AM  Result Value Ref Range   WBC 12.6 (H) 4.0 - 10.5 K/uL   RBC 3.39 (L) 4.22 - 5.81 MIL/uL   Hemoglobin 9.2 (L) 13.0 - 17.0 g/dL   HCT 28.7 (L) 39.0 - 52.0 %   MCV 84.7 78.0 - 100.0 fL   MCH 27.1 26.0 - 34.0 pg   MCHC 32.1 30.0 - 36.0 g/dL   RDW 15.6 (H) 11.5 - 15.5 %   Platelets 104 (L) 150 - 400 K/uL    Comment: CONSISTENT WITH PREVIOUS RESULT  Comprehensive metabolic panel     Status: Abnormal   Collection Time: 10/27/15  5:04 AM  Result Value Ref Range   Sodium 135 135 - 145 mmol/L   Potassium 3.7 3.5 - 5.1 mmol/L   Chloride 105 101 - 111 mmol/L   CO2 24 22 - 32 mmol/L   Glucose, Bld 115 (H) 65 - 99 mg/dL   BUN 26 (H) 6 - 20 mg/dL   Creatinine, Ser 0.70 0.61 - 1.24 mg/dL   Calcium 8.3 (L) 8.9 - 10.3 mg/dL   Total Protein 6.0 (L) 6.5 - 8.1 g/dL   Albumin 2.8 (L) 3.5 - 5.0 g/dL   AST 35 15 - 41 U/L   ALT 25 17 - 63 U/L   Alkaline Phosphatase 386 (H) 38 - 126 U/L   Total Bilirubin 1.5 (H) 0.3 - 1.2 mg/dL   GFR calc non Af Amer >60 >60 mL/min   GFR calc Af Amer >60 >60 mL/min    Comment: (NOTE) The eGFR has been calculated using the CKD EPI equation. This calculation has not been validated in all clinical situations. eGFR's persistently <60 mL/min signify possible Chronic Kidney Disease.    Anion gap 6 5 - 15  CBC     Status: Abnormal   Collection Time: 10/27/15  5:04 AM  Result Value Ref Range   WBC 14.0 (H) 4.0 - 10.5 K/uL   RBC 3.45 (L) 4.22 - 5.81 MIL/uL   Hemoglobin 9.4 (L) 13.0 - 17.0 g/dL   HCT  28.9 (L) 39.0 - 52.0 %   MCV 83.8 78.0 -  100.0 fL   MCH 27.2 26.0 - 34.0 pg   MCHC 32.5 30.0 - 36.0 g/dL   RDW 15.6 (H) 11.5 - 15.5 %   Platelets 109 (L) 150 - 400 K/uL    Comment: CONSISTENT WITH PREVIOUS RESULT  Protime-INR     Status: Abnormal   Collection Time: 10/27/15  9:45 AM  Result Value Ref Range   Prothrombin Time 19.6 (H) 11.6 - 15.2 seconds   INR 1.66 (H) 0.00 - 1.49    No results found.  Review of Systems  Constitutional: Positive for weight loss. Negative for fever, chills, malaise/fatigue and diaphoresis.  HENT: Negative.   Eyes: Negative.   Respiratory: Negative.   Cardiovascular: Negative.   Gastrointestinal: Negative.   Genitourinary: Negative.   Neurological: Negative.  Negative for weakness.  Psychiatric/Behavioral: Negative.    Blood pressure 149/95, pulse 118, temperature 98.2 F (36.8 C), temperature source Oral, resp. rate 20, height 5' 10"  (1.778 m), weight 80.9 kg (178 lb 5.6 oz), SpO2 93 %. Physical Exam  Constitutional: He is oriented to person, place, and time. He appears well-developed and well-nourished. No distress.  Cardiovascular:  S1S2 irregularly irregular, tachy  Respiratory: Effort normal and breath sounds normal. No respiratory distress. He has no wheezes. He has no rales. He exhibits no tenderness.  GI: Soft. Bowel sounds are normal. He exhibits no distension and no mass. There is no tenderness. There is no rebound and no guarding.  Left left inguinal hernia extending into the scrotum.  It is reducible, soft and without erythema or tenderness.  Musculoskeletal: Normal range of motion. He exhibits no edema or tenderness.  Neurological: He is alert and oriented to person, place, and time.  Skin: Skin is warm and dry. No rash noted. He is not diaphoretic. No erythema. No pallor.  Psychiatric: He has a normal mood and affect. His behavior is normal. Judgment and thought content normal.    Assessment/Plan: Large inguinal hernia-soft and  reducible.  No indications for surgical intervention at this time.  I will order him a scrotal support to help alleviate some of his symptoms. Surgery will continue to follow.  Thank you for the consult. Newly diagnosed pulmonary embolism-was on eliquis, on heparin gtt stopped, biopsy today.  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

## 2015-10-27 NOTE — Progress Notes (Signed)
Biopsy/Paracentesis site clean dry.intact, no bleeding noted.

## 2015-10-27 NOTE — Progress Notes (Signed)
Still with pain from scrotal hernia, and left neck stiffness.  Hgb 9.4, Platelets 109k. INR pending. For liver bx with IR later today.  L Hallel Denherder, PA-C [facility]

## 2015-10-27 NOTE — Progress Notes (Signed)
BP 149/100, paged Rogue Bussing and he placed order for PRN hydralizine. BP rechecked at 152/95. Order parameters not met, hydralizine not given. Will continue to monitor.

## 2015-10-27 NOTE — Progress Notes (Signed)
Initial Nutrition Assessment  DOCUMENTATION CODES:   Not applicable  INTERVENTION:  - Diet advancement as medically feasible following liver biopsy - RD will continue to monitor for needs with diet advancement   NUTRITION DIAGNOSIS:   Inadequate oral intake related to inability to eat as evidenced by NPO status.  GOAL:   Patient will meet greater than or equal to 90% of their needs  MONITOR:   Diet advancement, Weight trends, Labs, Skin, I & O's  REASON FOR ASSESSMENT:   Malnutrition Screening Tool  ASSESSMENT:   79 y.o. male who had a CT scan of abdomen and pelvis that was performed on 10/21/2015 that showed abnormal enhancement along the walls of the distended common bile duct that were highly suspicious for cholangiocarcinoma.   Pt seen for MST. BMI indicates overweight status. Pt is NPO pending liver biopsy today. Family member, who is at bedside, reports that pt had a good appetite PTA but that recently he has been trying to cut back on portion sizes in an effort to lose weight. She states his appetite has also decreased over the past 1-2 weeks due to increase in pain resulting from hernia.  Pt uninterested in talking RD at time of visit and is primarily concerned with pain control and asking about Morphine. Informed RN of pt's concerns.   Unable to perform physical assessment at this time. Per chart review, pt has lost 17 lbs (8.7% body weight) in the past 10 months which is not significant for time frame.  Will continue to monitor for needs following biopsy; nutrition needs may need adjusted if malignancy confirmed.  Unsure if pt was meeting needs PTA. Medications reviewed. Labs reviewed; BUN elevated, Alk Phos elevated, Ca: 8.3 mg/dL.   Diet Order:  Diet NPO time specified Except for: Sips with Meds  Skin:  Reviewed, no issues  Last BM:  10/30  Height:   Ht Readings from Last 1 Encounters:  10/21/2015 5' 10"  (1.778 m)    Weight:   Wt Readings from Last 1  Encounters:  09/27/2015 178 lb 5.6 oz (80.9 kg)    Ideal Body Weight:  75.45 kg (kg)  BMI:  Body mass index is 25.59 kg/(m^2).  Estimated Nutritional Needs:   Kcal:  2200-2400  Protein:  80-90 grams  Fluid:  2.2 L/day  EDUCATION NEEDS:   No education needs identified at this time     Jarome Matin, RD, LDN Inpatient Clinical Dietitian Pager # [phone] After hours/weekend pager # [phone]

## 2015-10-27 NOTE — Progress Notes (Signed)
Patient back from interventional radiology post biopsy and paracentesis. Patient alert but still drowsy, denies pain around paracentesis site and site dressing C/D/I. Vital WNL, will continue to monitor patient.

## 2015-10-27 NOTE — Care Management Important Message (Signed)
Important Message  Patient Details  Name: Osaze Hubbert MRN: [id number] Date of Birth: 1936-09-27   Medicare Important Message Given:  Yes-second notification given    Camillo Flaming 10/27/2015, 2:44 [place] Message  Patient Details  Name: Ricki Clack MRN: [id number] Date of Birth: 12-16-1936   Medicare Important Message Given:  Yes-second notification given    Camillo Flaming 10/27/2015, 2:43 PM

## 2015-10-27 NOTE — Progress Notes (Signed)
Patient unable to use the scrotal support, stated its very uncomfortable and its not helping.

## 2015-10-27 NOTE — Procedures (Signed)
Successful US guided paracentesis yielding 450 L of serous, slightly bloody ascitic fluid. All aspirated ascitic fluid sent to lab for cytologic analysis. Technically successful US guided biopsy of indeterminate nodule within the right lobe of the liver.   Sample sent to laboratory as requested. No immediate post procedural complications.   Ronny Bacon, MD Pager #: [phone]

## 2015-10-27 NOTE — Progress Notes (Signed)
[facility] PROGRESS NOTE  Nahum Sherrer ERX:[id number] DOB: Oct 22, 1936 DOA: 10/23/2015 PCP: Tivis Ringer, MD  Interim Summary Mr.Doorn is a pleasant 79 year old gentleman with a past medical history of chronic atrial fibrillation who had been chronically anticoagulated with Eliquis, also having a history of scrotal hernia who is highly functional at baseline. He recently had a CT scan of abdomen and pelvis performed on 10/21/2015 that revealed on abnormal enhancement along the walls of the common bile duct highly suspicious for cholangiocarcinoma. He was admitted for further workup. A CT scan of lungs with IV contrast revealed multiple bilateral pulmonary emboli. Also noted were bilateral pulmonary nodules suspicious for metastatic disease. He was admitted to the step down unit and started on IV heparin. With regard to liver findings he was further worked up with MRCP. This study revealed progressive focal intrahepatic biliary dilatation in the lateral segment of the left hepatic lobe worrisome for intrahepatic cholangiocarcinoma. Radiology also reported numerous new rim-enhancing lesions throughout both lobes measuring up to 11 mm compatible with metastasis.  Assessment/Plan: 1. Pulmonary embolism. -Mr Bargar having a history of atrial fibrillation who had been anticoagulating with Eliquis therapy, having imaging studies showing probable cholangiocarcinoma. -CT scan of lungs with IV contrast revealed bilateral pulmonary emboli. -He was initially started on IV heparin with the discontinuation of Eliquis.  -Overnight he remained hemodynamically stable.  -Will transition to subcutaneous Lovenox. Pharmacy was consulted for the discontinuation of IV heparin and initiating Lovenox therapy.  -In the setting of malignancy he will require [place] Lovenox on discharge.  -He is showing clinical improvement, now off supplemental oxygen.   2.  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 -Mr Driskill having a CT scan  of abdomen and pelvis that was performed on 10/21/2015 that showed abnormal enhancement along the walls of the distended common bile duct that were highly suspicious for cholangiocarcinoma. This was further worked up with an MRCP which have demonstrated findings concerning for cholangiocarcinoma with liver metastasis. -US guided biopsy of liver lesions on 10/27/2015, GI following  3.  Atrial fibrillation CHADSVasc Score of 3.  -Patient with history of chronic atrial fibrillation on metoprolol and Eliquis therapy prior to this hospitalization. -Anticoagulation will be changed to Lovenox given development of pulmonary embolism in the context of malignancy -On 10/25/2015 I increased his Metoproplol to 100 mg PO q daily for better blood pressure and rate control. Overall blood pressures improved.   4.  Coagulopathy.  -Labs showing INR of 2.32 which could be due to underlying liver involvement from malignancy. He was also on Eliquis therapy which can increase INR as well.  5.  Massive scrotal hernia. -Having acute pulmonary embolism he is at a high surgical risk presently. -He can be evaluated in the outpatient setting for hernia repair. At the present time this does not appear to be strangulated, is having regular bowel movements. -seems his main complaint is the persistent pain associated with his hernia, surgery consulted.  6.  Thrombocytopenia. -Platelet count improved to 104,000 -Will continue to monitor closely as he is on anticoagulation for treatment of pulmonary embolism  7.  Constipation  -Likely as a result of decreased mobility and narcotic analgesics.  -Will place him on a bowel regimen with Senna-K and Miralax.   Code Status: Full code Family Communication: patient and wife at bedside Disposition Plan: pending  Consultants:  GI  IR  General surgery  Procedures:  MRCP performed on 10/25/2015  US guided biopsy of liver lesion on 10/31   HPI/Subjective: Awaiting for  biopsy, npo ,  on room air, denies sob, no chest pain, no n/v, main complaint is scrotal pain  Objective: Filed Vitals:   10/27/15 0936  BP: 149/95  Pulse: 118  Temp:   Resp:     Intake/Output Summary (Last 24 hours) at 10/27/15 1107 Last data filed at 10/27/15 4098  Gross per 24 hour  Intake 2726.25 ml  Output     51 ml  Net 2675.25 ml   Filed Weights   10/07/2015 1708 10/03/2015 1845  Weight: 181 lb 12.8 oz (82.464 kg) 178 lb 5.6 oz (80.9 kg)    Exam:   General:  Patient is awake and alert, sitting at bedside chair, no acute distress  Cardiovascular: irregular rate and rhythm normal S1-S2 no murmurs rubs or gallops   Respiratory: Normal respiratory effort, lungs overall clear to auscultation bilaterally   Abdomen: Soft nontender nondistended positive bowel sounds  Musculoskeletal:  no edema   Genitourinary : There is a massive left sided scrotal hernia present, nontender, reducible.   Data Reviewed: Basic Metabolic Panel:  Recent Labs Lab 10/23/15 1225 10/26/2015 1454 2015/11/01 0303 10/26/15 0445 10/27/15 0504  NA 133* 133* 135 136 135  K 4.2 4.1 4.0 3.9 3.7  CL 101 103 103 105 105  CO2 23 21* 24 25 24   GLUCOSE 118* 115* 111* 127* 115*  BUN 34* 35* 36* 29* 26*  CREATININE 0.75 0.72 0.76 0.72 0.70  CALCIUM 8.8 8.7* 8.4* 8.3* 8.3*   Liver Function Tests:  Recent Labs Lab 10/23/15 1225 09/29/2015 1454 11/01/2015 0303 10/27/15 0504  AST 38* 47* 35 35  ALT 28 30 26 25   ALKPHOS 420* 440* 401* 386*  BILITOT 1.5* 1.4* 1.0 1.5*  PROT 6.3 6.2* 5.8* 6.0*  ALBUMIN 3.1* 3.0* 2.7* 2.8*   No results for input(s): LIPASE, AMYLASE in the last 168 hours. No results for input(s): AMMONIA in the last 168 hours. CBC:  Recent Labs Lab 10/23/15 1225 10/17/2015 1454 2015/11/01 0303 10/26/15 0445 10/27/15 0504  WBC 13.3* 12.8* 12.1* 12.6* 14.0*  NEUTROABS 11.2* 10.8*  --   --   --   HGB 10.7* 9.8* 9.2* 9.2* 9.4*  HCT 32.2* 29.5* 28.4* 28.7* 28.9*  MCV 82.1 82.9 83.8  84.7 83.8  PLT 51.0 Repeated and verified X2.* 54* 76* 104* 109*   Cardiac Enzymes: No results for input(s): CKTOTAL, CKMB, CKMBINDEX, TROPONINI in the last 168 hours. BNP (last 3 results)  Recent Labs  10/13/2015 1530  BNP 490.6*    ProBNP (last 3 results) No results for input(s): PROBNP in the last 8760 hours.  CBG: No results for input(s): GLUCAP in the last 168 hours.  Recent Results (from the past 240 hour(s))  MRSA PCR Screening     Status: None   Collection Time: 10/22/2015  6:48 PM  Result Value Ref Range Status   MRSA by PCR NEGATIVE NEGATIVE Final    Comment:        The GeneXpert MRSA Assay (FDA approved for NASAL specimens only), is one component of a comprehensive MRSA colonization surveillance program. It is not intended to diagnose MRSA infection nor to guide or monitor treatment for MRSA infections.      Studies: Mr 3d Recon At Scanner  11/01/2015  CLINICAL DATA:  Suspected cholangiocarcinoma, elevated CA 19-9 EXAM: MRI ABDOMEN WITHOUT AND WITH CONTRAST (INCLUDING MRCP) TECHNIQUE: Multiplanar multisequence MR imaging of the abdomen was performed both before and after the administration of intravenous contrast. Heavily T2-weighted images of the biliary and pancreatic ducts were obtained,  and three-dimensional MRCP images were rendered by post processing. CONTRAST:  63mL MULTIHANCE GADOBENATE DIMEGLUMINE 529 MG/ML IV SOLN COMPARISON:  CT abdomen pelvis dated 10/21/2015 and 02/26/2015 FINDINGS: Severely motion degraded images. Lower chest: Small bilateral pleural effusions, right greater than left. Cardiomegaly. Hepatobiliary: Focal biliary dilatation in the lateral segment left hepatic lobe (series 10/image 24; series 1104/image 40), progressive from 02/2015, worrisome for underlying intrahepatic cholangiocarcinoma. Numerous rim enhancing lesions throughout both lobes, measuring up to 11 mm (series 1104/image 30), new from 02/2015, compatible with metastases.  Distended gallbladder with multiple layering gallstones. Dilated common duct, measuring 12 mm (series 400/ image 82). At least one 10 mm mid/distal CBD stone is present (series 3/ image 39). Pancreas: Within normal limits. Spleen: Within normal limits. Adrenals/Urinary Tract: Adrenal glands are within normal limits. Multiple bilateral renal cysts measuring up to 8.1 cm in the anterior right lower kidney (series 10/ image 41). Hemorrhagic left renal cysts measuring up to 1.7 cm (series 10/ image 35). No hydronephrosis. Stomach/Bowel: Stomach and visualized bowel are grossly unremarkable. Vascular/Lymphatic: No evidence of abdominal aortic aneurysm. Suspected upper abdominal/retroperitoneal lymph nodes are better evaluated on CT due to motion degradation. Other: Small volume abdominal ascites. Musculoskeletal: No focal osseous lesions. IMPRESSION: Severely motion degraded images. Progressive focal intrahepatic biliary dilation in the lateral segment left hepatic lobe, worrisome for intrahepatic cholangiocarcinoma. Numerous new rim enhancing lesions throughout both lobes, measuring up to 11 mm, compatible with metastases. Suspected upper abdominal/retroperitoneal lymph nodes are better evaluated on recent CT due to motion degradation. Cholelithiasis with choledocholithiasis. Common duct measures 11 mm and is notable for at least one mid/distal CBD stone measuring 10 mm. Additional ancillary findings as above. Electronically Signed   By: Julian Hy M.D.   On: 10/25/2015 13:39   Mr Abd W/wo Cm/mrcp  10/25/2015  CLINICAL DATA:  Suspected cholangiocarcinoma, elevated CA 19-9 EXAM: MRI ABDOMEN WITHOUT AND WITH CONTRAST (INCLUDING MRCP) TECHNIQUE: Multiplanar multisequence MR imaging of the abdomen was performed both before and after the administration of intravenous contrast. Heavily T2-weighted images of the biliary and pancreatic ducts were obtained, and three-dimensional MRCP images were rendered by post  processing. CONTRAST:  58mL MULTIHANCE GADOBENATE DIMEGLUMINE 529 MG/ML IV SOLN COMPARISON:  CT abdomen pelvis dated 10/21/2015 and 02/26/2015 FINDINGS: Severely motion degraded images. Lower chest: Small bilateral pleural effusions, right greater than left. Cardiomegaly. Hepatobiliary: Focal biliary dilatation in the lateral segment left hepatic lobe (series 10/image 24; series 1104/image 40), progressive from 02/2015, worrisome for underlying intrahepatic cholangiocarcinoma. Numerous rim enhancing lesions throughout both lobes, measuring up to 11 mm (series 1104/image 30), new from 02/2015, compatible with metastases. Distended gallbladder with multiple layering gallstones. Dilated common duct, measuring 12 mm (series 400/ image 82). At least one 10 mm mid/distal CBD stone is present (series 3/ image 39). Pancreas: Within normal limits. Spleen: Within normal limits. Adrenals/Urinary Tract: Adrenal glands are within normal limits. Multiple bilateral renal cysts measuring up to 8.1 cm in the anterior right lower kidney (series 10/ image 41). Hemorrhagic left renal cysts measuring up to 1.7 cm (series 10/ image 35). No hydronephrosis. Stomach/Bowel: Stomach and visualized bowel are grossly unremarkable. Vascular/Lymphatic: No evidence of abdominal aortic aneurysm. Suspected upper abdominal/retroperitoneal lymph nodes are better evaluated on CT due to motion degradation. Other: Small volume abdominal ascites. Musculoskeletal: No focal osseous lesions. IMPRESSION: Severely motion degraded images. Progressive focal intrahepatic biliary dilation in the lateral segment left hepatic lobe, worrisome for intrahepatic cholangiocarcinoma. Numerous new rim enhancing lesions throughout both lobes, measuring up to  11 mm, compatible with metastases. Suspected upper abdominal/retroperitoneal lymph nodes are better evaluated on recent CT due to motion degradation. Cholelithiasis with choledocholithiasis. Common duct measures 11 mm  and is notable for at least one mid/distal CBD stone measuring 10 mm. Additional ancillary findings as above. Electronically Signed   By: Julian Hy M.D.   On: 10/25/2015 13:39    Scheduled Meds: . bisacodyl  10 mg Rectal Once  . enoxaparin (LOVENOX) injection  1 mg/kg Subcutaneous Q12H  . lactulose  20 g Oral Once  . LORazepam  0.5 mg Intravenous Once  . metoprolol succinate  100 mg Oral Daily  . polyethylene glycol  17 g Oral Daily  . senna-docusate  1 tablet Oral BID  . sodium chloride  3 mL Intravenous Q12H  . sodium chloride  3 mL Intravenous Q12H   Continuous Infusions: . sodium chloride 75 mL/hr at 10/27/15 6945    Principal Problem:   Pulmonary embolism (HCC) Active Problems:   Cholangiocarcinoma (HCC)   HTN (hypertension)   Dyslipidemia   Bile duct abnormality   Liver masses    Time spent: 30 min    Racer Quam MD PhD  [facility] Pager [phone]. If 7PM-7AM, please contact night-coverage at [email or web address], password [facility] 10/27/2015, 11:07 AM  LOS: 3 days

## 2015-10-27 NOTE — Progress Notes (Addendum)
RN paged urgently due to pt having increased lethargy and hypoxia with rapid HR. Pt had paracentesis today and received sedation. Ordered 12 lead and CXR and NP to bedside. S: pt can not participate in ROS secondary to lethargy. RN is unsure of last normal since he was sedated in IR. O: Chronically ill appearing elderly WM in no distress. He only responds to chest rub. With this he moans and reaches up with his right arm to push NP away. No movement of left arm is observed even to painful stimuli. Left lower extremity responds to pain but does not move with the quick response of right leg. Pupils are equal and reactive but left eye is deviated to the right. Weakness noted to the left eye as he doesn't resist to forced eye opening like he does on the right. Non verbal. Doesn't follow commands.  A/P: 1. Lethargy with increased O2 demand and change in mental status-CODE STROKE called. Pt taken urgently to CT scan. Neurology in route. Bolus and 100cc/hr started. Labs, CXR pending.  After CT, pt taken to ICU. Dr. Janann Colonel of neuro at bedside stating this is likely a MCA infarct. Pt is not a TPA candidate and Dr. Janann Colonel is speaking to pt's son about treatment. This NP had spoken to pt's son as well before CT. Son stated he would like pt to be made comfortable and he wouldn't want to see his father "coded" or intubated. However, pt is married to 2nd wife. Son states wife doesn't speak [place] fluently. This NP attempted to call pt's wife without success. NP finally got in touch with pt's wife to inform her of his probable stroke and to broach the subject of code status. This NP unsure of her comprehension of situation over the phone. She is going to try to get pt's son to pick her up to come to the hospital.  2. AFib with RVR confirmed with EKG-on Eliquis at home. Metoprolol given and will decide on further meds after see results. May need cardizem drip.  3. Stage IV cholangiocarcinoma-unfortunate gentleman who just  learned of this dx. Had parancentesis today per IR. This dx definitely impacts workup and further care. Will continue to speak with family as will neuro.  4. PE-was on eliquis at home for Afib and Lovenox here. Had coags with am.  Will follow closely and f/up labs, CT, etc.  Clance Boll, NP [facility] Update: Neurologist informed wife of recommendations of NOT treating the stroke or workup for stroke. This NP had a discussion with wife and afterwards she agreed to comfort care with DNR/DNI. Wife stated that pt was already asking for pain meds a lot because of the cancer/hernia. Wife said she didn't want him to suffer. Pt's daughter and son also at hospital and agree with wife's decision. Morphine drip and Ativan ordered. Continue Foley, O2 for comfort.  Clance Boll, NP [facility] Update: HR still in 120s even with Dilt drip at 15. However, BP dropped to the 60s so Dilt drip turned off.  KJKG, NP [facility] Update: Pt expired at 0335 2015-11-16. Family at bedside. Comfort and condolences offered. Family is coping well.  Death certificate completed.  KJKG, NP [facility]

## 2015-10-27 NOTE — Consult Note (Signed)
Stroke Consult Consulting Physician: Baltazar Najjar NP [facility]  Chief Complaint: AMS, left sided weakness HPI: Brandon Dean is an 79 y.o. male hx of A fib, stage 4 cancer, PE with acute change in mental status associated with left sided weakness. Unclear LSW. Patient had paracentesis completed around 1630 with notes not mentioning any deficits at that time. Evening RN noted patient was lethargic, around 2230 she noted he was not responding or using his left side. Code stroke activated.   CT head imaging reviewed, no acute process. Patient received full dose lovenox around 2100. Noted to be in A fib with RVR.   Called and discussed situation with patients son, Dr Crist Infante. Counseled him on exam findings and concern for stroke. He expressed understanding. States that due to other health problems he would not want to pursue IR intervention at this time.   Date last known well: 10/27/2015 Time last known well: unclear tPA Given: no, unclear LSW, received full dose lovenox Modified Rankin: Rankin Score=1  Past Medical History  Diagnosis Date  . Persistent atrial fibrillation ([place])   . Ascending aortic aneurysm ([place])   . MVP (mitral valve prolapse)   . Mild aortic insufficiency   . Systemic hypertension   . Permanent atrial fibrillation ([place]) 01/24/2015  . Inguinal hernia 1980s.     Past Surgical History  Procedure Laterality Date  . Orif clavicle fracture  1946    left  . Carpal tunnel release  2010    left  . US echocardiography  03/08/2012    severe left atrial dilatation, moderate right atrial dilatation,mild aortic sclerosis,mild AI, mild bileaflet MVP,trace MR,mild aortic root dilatation.  . Right finger mass Right 2011    removed, benign.     Family History  Problem Relation Age of Onset  . Cancer Mother     breast  . Heart attack Father   . Heart failure Brother   . Hypertension Sister    Social History:  reports that he quit smoking about 48 years ago. He does not have any  smokeless tobacco history on file. He reports that he drinks alcohol. He reports that he does not use illicit drugs.  Allergies:  Allergies  Allergen Reactions  . Pradaxa [Dabigatran Etexilate Mesylate]     Caused intolerable reflux sxs.   Alveda Reasons [Rivaroxaban]     Caused bleeding of the gums.     Medications Prior to Admission  Medication Sig Dispense Refill  . atorvastatin (LIPITOR) 10 MG tablet Take 1 tablet by mouth daily.  0  . Cholecalciferol (VITAMIN D) 2000 UNITS tablet Take 4,000 Units by mouth daily.     Marland Kitchen ELIQUIS 5 MG TABS tablet Take 5 mg by mouth 2 (two) times daily.   1  . folic acid (FOLVITE) 353 MCG tablet Take 400 mcg by mouth daily.     . metoprolol succinate (TOPROL-XL) 50 MG 24 hr tablet Take 50 mg by mouth daily. Take with or immediately following a meal.    . Omega-3 Fatty Acids (FISH OIL PO) Take 1 tablet by mouth daily.    . traMADol (ULTRAM) 50 MG tablet Take 1-2 tablets by mouth every 6 (six) hours as needed. Pain.  0  . ciprofloxacin (CIPRO) 500 MG tablet Take 1 tablet (500 mg total) by mouth 2 (two) times daily. 20 tablet 0    ROS: Out of a complete 14 system review, the patient complains of only the following symptoms, and all other reviewed systems are negative. + weaklness  Physical Examination: Filed Vitals:   10/27/15 2227  BP: 126/75  Pulse: 53  Temp:   Resp: 24   Physical Exam  Constitutional: He appears well-developed and well-nourished.  Psych: Affect appropriate to situation Eyes: No scleral injection HENT: No OP obstrucion Head: Normocephalic.  Cardiovascular: rapid, irregular Respiratory: Effort normal and breath sounds normal.  GI: Soft. Bowel sounds are normal. No distension. There is no tenderness.  Skin: WDI,large hernia   Neurologic Examination: Mental Status: Eyes closed, non-verbal, not following commands. Localizes to noxious stimuli on the right side Cranial Nerves: II: optic discs not visualized, no blink to  threat on the left, pupils equal, round, reactive to light  III,IV, VI: ptosis not present, right gaze preference, unable to get eyes past midline V,VII: face symmetric, unable to test facial sensation VIII: hearing normal bilaterally IX,X: gag reflex present XI: unable to test XII: unable to test Motor: Minimal spontaneous movement right side, no spontaneous movement of left side Sensory: withdrawals right side to noxious stimuli. No withdrawal to noxious stimuli of LUE, minimal withdrawal in LLE Deep Tendon Reflexes: 1+ and symmetric throughout Plantars: Right: downgoing   Left: downgoing Cerebellar: Unable to test Gait: unable to test  Laboratory Studies:   Basic Metabolic Panel:  Recent Labs Lab 10/23/15 1225 10/06/2015 1454 10/25/15 0303 10/26/15 0445 10/27/15 0504  NA 133* 133* 135 136 135  K 4.2 4.1 4.0 3.9 3.7  CL 101 103 103 105 105  CO2 23 21* 24 25 24   GLUCOSE 118* 115* 111* 127* 115*  BUN 34* 35* 36* 29* 26*  CREATININE 0.75 0.72 0.76 0.72 0.70  CALCIUM 8.8 8.7* 8.4* 8.3* 8.3*    Liver Function Tests:  Recent Labs Lab 10/23/15 1225 10/21/2015 1454 10/25/15 0303 10/27/15 0504  AST 38* 47* 35 35  ALT 28 30 26 25   ALKPHOS 420* 440* 401* 386*  BILITOT 1.5* 1.4* 1.0 1.5*  PROT 6.3 6.2* 5.8* 6.0*  ALBUMIN 3.1* 3.0* 2.7* 2.8*   No results for input(s): LIPASE, AMYLASE in the last 168 hours. No results for input(s): AMMONIA in the last 168 hours.  CBC:  Recent Labs Lab 10/23/15 1225 10/02/2015 1454 10/25/15 0303 10/26/15 0445 10/27/15 0504  WBC 13.3* 12.8* 12.1* 12.6* 14.0*  NEUTROABS 11.2* 10.8*  --   --   --   HGB 10.7* 9.8* 9.2* 9.2* 9.4*  HCT 32.2* 29.5* 28.4* 28.7* 28.9*  MCV 82.1 82.9 83.8 84.7 83.8  PLT 51.0 Repeated and verified X2.* 54* 76* 104* 109*    Cardiac Enzymes: No results for input(s): CKTOTAL, CKMB, CKMBINDEX, TROPONINI in the last 168 hours.  BNP: Invalid input(s): POCBNP  CBG: No results for input(s): GLUCAP in the last  168 hours.  Microbiology: Results for orders placed or performed during the hospital encounter of 10/14/2015  MRSA PCR Screening     Status: None   Collection Time: 10/03/2015  6:48 PM  Result Value Ref Range Status   MRSA by PCR NEGATIVE NEGATIVE Final    Comment:        The GeneXpert MRSA Assay (FDA approved for NASAL specimens only), is one component of a comprehensive MRSA colonization surveillance program. It is not intended to diagnose MRSA infection nor to guide or monitor treatment for MRSA infections.     Coagulation Studies:  Recent Labs  10/27/15 0945  LABPROT 19.6*  INR 1.66*    Urinalysis:  Recent Labs Lab 09/28/2015 1608  COLORURINE AMBER*  LABSPEC 1.025  PHURINE 5.5  GLUCOSEU NEGATIVE  HGBUR SMALL*  BILIRUBINUR SMALL*  KETONESUR NEGATIVE  PROTEINUR 100*  UROBILINOGEN 1.0  NITRITE NEGATIVE  LEUKOCYTESUR NEGATIVE    Lipid Panel:  No results found for: CHOL, TRIG, HDL, CHOLHDL, VLDL, LDLCALC  HgbA1C: No results found for: HGBA1C  Urine Drug Screen:  No results found for: LABOPIA, COCAINSCRNUR, LABBENZ, AMPHETMU, THCU, LABBARB  Alcohol Level: No results for input(s): ETH in the last 168 hours.  Other results:  Imaging: Ct Head Wo Contrast  10/27/2015  CLINICAL DATA:  Agitated, lethargic, altered mental status. Unable to cooperate. Anticoagulated. EXAM: CT HEAD WITHOUT CONTRAST TECHNIQUE: Contiguous axial images were obtained from the base of the skull through the vertex without intravenous contrast. COMPARISON:  None. FINDINGS: There is no intracranial hemorrhage, mass or evidence of acute infarction. There is moderate generalized atrophy. There is mild chronic microvascular ischemic change. There is no significant extra-axial fluid collection. No acute intracranial findings are evident. The visible paranasal sinuses are clear. There is chronic sclerosis of the mastoid tips bilaterally. IMPRESSION: Moderate generalized atrophy and chronic microvascular  changes. No acute findings. Electronically Signed   By: Andreas Newport M.D.   On: 10/27/2015 23:05   US Biopsy  10/27/2015  INDICATION: No known primary, now with concern for cholangiocarcinoma metastatic to the liver. Please perform ultrasound-guided liver lesion biopsy for tissue diagnostic purposes. Given the presence of small volume intra-abdominal ascites, a diagnostic and therapeutic paracentesis was performed prior to the ultrasound-guided liver lesion biopsy EXAM: 1. ULTRASOUND-GUIDED PARACENTESIS 2. ULTRASOUND GUIDED LIVER LESION BIOPSY COMPARISON:  Abdominal MRI - 10/25/2015 semi abdominal CT - 10/21/2015; 02/26/2015 MEDICATIONS: Fentanyl 25 mcg IV; Versed 1 mg IV ANESTHESIA/SEDATION: Total Moderate Sedation time 15 minutes COMPLICATIONS: None immediate PROCEDURE: Informed written consent was obtained from the patient after a discussion of the risks, benefits and alternatives to treatment. The patient understands and consents the procedure. A timeout was performed prior to the initiation of the procedure. Ultrasound scanning was performed of the right upper abdominal quadrant demonstrates a trace amount of intra-abdominal ascites as well as several ill-defined hypo attenuating liver lesions compatible with the findings seen on preceding abdominal CT and abdominal MRI. The right upper abdominal quadrant was prepped and draped in the usual sterile fashion. The overlying soft tissues were anesthetized with 1% lidocaine with epinephrine. Under direct ultrasound guidance, a Yueh sheath needle was advanced into the peritoneal space and a paracentesis was performed yielding approximately 450 cc of serous, slightly bloody peritoneal fluid. All aspirated ascitic fluid was capped and sent to the laboratory for cytologic analysis. Given sonographic window, a dominant approximately 0.8 x 0.7 cm nodule within the right lobe of the liver was targeted for biopsy (image 12). A 17 gauge, 6.8 cm co-axial needle was  advanced into a peripheral aspect of the lesion. This was followed by the acquisition of 3 core biopsies with an 18 gauge core device under direct ultrasound guidance. Note, the examination was degraded secondary to patient's marked respiratory excursion and difficulty following breathing instructions. The coaxial needle tract was embolized with a small amount of Gel-Foam slurry and superficial hemostasis was obtained with manual compression. Post procedural scanning was negative for definitive area of hemorrhage or additional complication. A dressing was placed. The patient tolerated the procedure well without immediate post procedural complication. IMPRESSION: 1. Technically successful ultrasound guided core needle biopsy of dominant indeterminate nodule within the right lobe of the liver. Note, the examination was degraded secondary to patient's marked respiratory excursion and difficulty following breathing instructions. 2. Successful  ultrasound-guided paracentesis yielding 450 cc of serous, slightly bloody peritoneal fluid. All aspirated fluid was capped and sent to the laboratory for cytologic analysis. Electronically Signed   By: Sandi Mariscal M.D.   On: 10/27/2015 17:17   US Paracentesis  10/27/2015  INDICATION: No known primary, now with concern for cholangiocarcinoma metastatic to the liver. Please perform ultrasound-guided liver lesion biopsy for tissue diagnostic purposes. Given the presence of small volume intra-abdominal ascites, a diagnostic and therapeutic paracentesis was performed prior to the ultrasound-guided liver lesion biopsy EXAM: 1. ULTRASOUND-GUIDED PARACENTESIS 2. ULTRASOUND GUIDED LIVER LESION BIOPSY COMPARISON:  Abdominal MRI - 10/25/2015 semi abdominal CT - 10/21/2015; 02/26/2015 MEDICATIONS: Fentanyl 25 mcg IV; Versed 1 mg IV ANESTHESIA/SEDATION: Total Moderate Sedation time 15 minutes COMPLICATIONS: None immediate PROCEDURE: Informed written consent was obtained from the patient  after a discussion of the risks, benefits and alternatives to treatment. The patient understands and consents the procedure. A timeout was performed prior to the initiation of the procedure. Ultrasound scanning was performed of the right upper abdominal quadrant demonstrates a trace amount of intra-abdominal ascites as well as several ill-defined hypo attenuating liver lesions compatible with the findings seen on preceding abdominal CT and abdominal MRI. The right upper abdominal quadrant was prepped and draped in the usual sterile fashion. The overlying soft tissues were anesthetized with 1% lidocaine with epinephrine. Under direct ultrasound guidance, a Yueh sheath needle was advanced into the peritoneal space and a paracentesis was performed yielding approximately 450 cc of serous, slightly bloody peritoneal fluid. All aspirated ascitic fluid was capped and sent to the laboratory for cytologic analysis. Given sonographic window, a dominant approximately 0.8 x 0.7 cm nodule within the right lobe of the liver was targeted for biopsy (image 12). A 17 gauge, 6.8 cm co-axial needle was advanced into a peripheral aspect of the lesion. This was followed by the acquisition of 3 core biopsies with an 18 gauge core device under direct ultrasound guidance. Note, the examination was degraded secondary to patient's marked respiratory excursion and difficulty following breathing instructions. The coaxial needle tract was embolized with a small amount of Gel-Foam slurry and superficial hemostasis was obtained with manual compression. Post procedural scanning was negative for definitive area of hemorrhage or additional complication. A dressing was placed. The patient tolerated the procedure well without immediate post procedural complication. IMPRESSION: 1. Technically successful ultrasound guided core needle biopsy of dominant indeterminate nodule within the right lobe of the liver. Note, the examination was degraded secondary  to patient's marked respiratory excursion and difficulty following breathing instructions. 2. Successful ultrasound-guided paracentesis yielding 450 cc of serous, slightly bloody peritoneal fluid. All aspirated fluid was capped and sent to the laboratory for cytologic analysis. Electronically Signed   By: Sandi Mariscal M.D.   On: 10/27/2015 17:17   Dg Chest Port 1 View  10/27/2015  CLINICAL DATA:  Hypoxia EXAM: PORTABLE CHEST - 1 VIEW COMPARISON:  10/25/2015 FINDINGS: Cardiac shadow is again enlarged. Increased density is noted over the right hemi thorax consistent with posteriorly layering effusion. Increased vascular congestion is noted. No focal confluent infiltrate is seen. IMPRESSION: Increasing central vascular congestion Right-sided pleural effusion. Electronically Signed   By: Inez Catalina M.D.   On: 10/27/2015 22:50    Assessment: 79 y.o. male hx of A fib, stage 4 cholangiocarcinoma, PE now with acute change in mental status and left sided weakness. Based on exam, concern for right MCA infarct. Unclear LSW and recently received full dose Lovenox so not a tPA  candidate. Due to multiple other co-morbidities he is not an ideal IR candidate, discussed with patients son, Dr Joylene Draft, who agrees with no IR intervention.   Would hold on further stroke workup pending discussion of goals of care with the family.   This patient is critically ill and at significant risk of neurological worsening, death and care requires constant monitoring of vital signs, hemodynamics,respiratory and cardiac monitoring,review of multiple databases, neurological assessment, discussion with family, other specialists and medical decision making of high complexity. I spent 35 inutes of neurocritical care time in the care of this patient.    Jim Like, DO [facility] [phone]  If 7pm- 7am, please page neurology on call as listed in [place]. 10/27/2015, 11:25 PM

## 2015-10-28 LAB — TSH: TSH: 1.94 u[IU]/mL (ref 0.350–4.500)

## 2015-10-28 MED ORDER — LORAZEPAM 2 MG/ML IJ SOLN: 0.5000 mg | Freq: Four times a day (QID) | INTRAMUSCULAR | Status: DC | PRN

## 2015-10-28 MED ORDER — SODIUM CHLORIDE 0.9 % IV SOLN
1.0000 mg/h | INTRAVENOUS | Status: DC
  Administered 2015-10-28: 1 mg/h via INTRAVENOUS
  Filled 2015-10-28: qty 10

## 2015-10-28 MED ORDER — SCOPOLAMINE 1 MG/3DAYS TD PT72
1.0000 | MEDICATED_PATCH | TRANSDERMAL | Status: DC
Start: 2015-10-28 — End: 2015-10-28
  Filled 2015-10-28: qty 1

## 2015-10-28 DEATH — deceased

## 2015-10-30 ENCOUNTER — Ambulatory Visit (HOSPITAL_COMMUNITY): Payer: Medicare Other

## 2015-11-14 ENCOUNTER — Ambulatory Visit: Payer: Medicare Other | Admitting: Cardiovascular Disease

## 2015-11-27 NOTE — Progress Notes (Addendum)
According to staff pt passed shortly after Chaplain was paged. When Chaplain arrived pt's wife, son and stepson were bedside. Family members were appropriately tearful. Pt's son, Dr. Joylene Draft, explained that pt was Catholic.  Family wanted prayer from [place] bedside of pt. Pt's wife respectfully excused herself and returned following prayer. Dr. Joylene Draft and son were very appreciative of Chaplain presence and prayer. Chaplain Marlise Eves Holder   11/03/2015 0400  Clinical Encounter Type  Visited With Family   Follow-up to family while pt's wife (of 72 years) was bedside. She asked what happened. Retrieved nurse to talk w/her as Brandon Dean talked w/pt's son outside of room. He explained Brandon Dean has been in denial of pt's illness and even earlier today wanted to know if something could be done. Nurse met bedside w/Brandon Dean.  Ernest Haber Chaplain

## 2015-11-27 NOTE — Progress Notes (Signed)
No respirations, no palpable radial or carotid pulse, no apical pulse auscultated. Confirmed by Benjamin Stain, RN. Emotional support provided to family at bedside. 245 cc Morphine gtt wasted  In sink by undersigned RN  And Benjamin Stain, RN

## 2015-11-27 NOTE — Progress Notes (Signed)
Patient lethargic upon 20:00 assessment. RN in patient's room to give medications at 21:30. Patient not responding to RN calling his name or shaking him. Patient did withdrawal from painful stimuli. HR in the 160's-170's, increased BP, respirations labored. Hydralazine 10 mg IV given according to parameters. On-call provider paged to come to the floor and assess the patient. On-call provider at bedside at 22:00. Code stroke called. Patient placed on 55% venti-mask and transported to CT for stat scan. Patient transferred to ICU from CT. Report given to ICU RN at bedside.

## 2015-11-27 NOTE — Discharge Summary (Signed)
Discharge Summary  Brandon Dean GEX:[id number] DOB: 1936-03-15  PCP: Tivis Ringer, MD  Admit date: 10-30-2015 Death time: 3:35am on 11/03/2015  Time spent: <13mins   Discharge Diagnoses:  Active Hospital Problems   Diagnosis Date Noted  . Pulmonary embolism ([place]) 30-Oct-2015  . Bile duct abnormality   . Liver masses   . Cholangiocarcinoma ([place]) 10-30-15  . HTN (hypertension) October 30, 2015  . Dyslipidemia Oct 30, 2015    Resolved Hospital Problems   Diagnosis Date Noted Date Resolved  No resolved problems to display.     Filed Weights   10-30-15 1708 30-Oct-2015 1845  Weight: 181 lb 12.8 oz (82.464 kg) 178 lb 5.6 oz (80.9 kg)    History of present illness:  Brandon Dean is a 79 y.o. male is a pleasant 79 year old gentleman having a past medical history of chronic atrial fibrillation on chronic anticoagulation with Eliquis, aortic aneurysm, hypertension, scrotal hernia who at baseline is highly functional and independent with all instrumental activities of daily living. He had a CT scan of abdomen and pelvis with IV contrast performed on 02/26/2015 which revealed 4 cm ascending thoracic aortic aneurysm along with an abnormal enhancement of the tip of the lateral segment of left liver. Mr Reim was set up with follow-up imaging however this was postponed for a trip to [place]. It appears that an MRI of abdomen was attempted several weeks ago however patient was unable to complete exam due to significant back pain. On 10/21/2015 he had a CT scan of abdomen and pelvis that revealed abnormal enhancement along the walls of the common bile duct highly suspicious for cholangiocarcinoma. He was referred to Dr.Jacobs of GI who ordered further labs including CA 19-9 that came back elevated at 690. He was scheduled to undergo MRCP for further workup. He was sent from Dr. Ardis Hughs office to the emergency room to undergo further evaluation. He had a CT scan of abdomen and pelvis with IV contrast of  lungs that revealed multiple bilateral pulmonary emboli. Furthermore this study revealed multiple bilateral pulmonary nodules that are suspicious for metastatic disease. Mr Tetreault's biggest concern is discomfort from scrotal hernia. He complains of pain and shortness of breath associated with this. He also complains of 10 pound weight loss over the past month. He denies fevers, chills, night sweats, chest pain, palpitations, recent falls. Patient was found to have bilateral PE, home meds apixaban changed to lovenox. He underwent liver biopsy on 10/31, later that night he became lethargic, not use his left side, code stroke activated, STAT CT head no acute findings, per neurology, concern for right MCA infarct, not a candidate for tPA due to on lovenox, not a ideal candidate for IR intervention. Patient also developed afib /RVR heart rate upto 120's, was briefly on cardizem drip , this was later stopped due to hypotension, due to significant comorbidities and likely underline malignancy, family decided no aggressive intervention, he was made DNR and started on comfort measures, patient expired at [facility] on 03-Nov-2015.    Hospital Course:  Principal Problem:   Pulmonary embolism (HCC) Active Problems:   Cholangiocarcinoma (HCC)   HTN (hypertension)   Dyslipidemia   Bile duct abnormality   Liver masses  while patient was in the hospital, he was treated for the following problems before he was started on comfort measures and then passed away.  1. Pulmonary embolism. -Mr Smigiel having a history of atrial fibrillation who had been anticoagulating with Eliquis therapy, having imaging studies showing probable cholangiocarcinoma. -CT scan of lungs with IV  contrast revealed bilateral pulmonary emboli. -He was initially started on IV heparin with the discontinuation of Eliquis.  -transitioned to lovenox.    2. 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 -Mr Kissick having a CT scan of abdomen and  pelvis that was performed on 10/21/2015 that showed abnormal enhancement along the walls of the distended common bile duct that were highly suspicious for cholangiocarcinoma. This was further worked up with an MRCP which have demonstrated findings concerning for cholangiocarcinoma with liver metastasis. -US guided biopsy of liver lesions on 10/27/2015, GI input appreciated.  3. Atrial fibrillation CHADSVasc Score of 3.  -Patient with history of chronic atrial fibrillation on metoprolol and Eliquis therapy prior to this hospitalization. -Anticoagulation will be changed to Lovenox given development of pulmonary embolism in the context of malignancy -increased his Metoproplol to 100 mg PO q daily for better blood pressure and rate control.  4. Coagulopathy.  -Labs showing INR of 2.32 which could be due to underlying liver involvement from malignancy. He was also on Eliquis therapy which can increase INR as well.  5. Massive scrotal hernia. -Having acute pulmonary embolism he is at a high surgical risk presently. -He can be evaluated in the outpatient setting for hernia repair. At the present time this does not appear to be strangulated, is having regular bowel movements. -seems his main complaint is the persistent pain associated with his hernia, surgery consulted.  6. Thrombocytopenia. -related to PE and acute illness   7. Constipation  -Likely as a result of decreased mobility and narcotic analgesics.  -provided bowel regimen with Senna-K and Miralax.    Consultants:  GI  IR  General surgery  neurology  Procedures:  MRCP performed on 10/25/2015  US guided biopsy of liver lesion on 10/31      Allergies  Allergen Reactions  . Pradaxa [Dabigatran Etexilate Mesylate]     Caused intolerable reflux sxs.   Alveda Reasons [Rivaroxaban]     Caused bleeding of the gums.       The results of significant diagnostics from this hospitalization (including imaging,  microbiology, ancillary and laboratory) are listed below for reference.    Significant Diagnostic Studies: Ct Abdomen Pelvis W Wo Contrast  10/21/2015  ADDENDUM REPORT: 10/21/2015 19:41 ADDENDUM: clinical data should read "Abnormal appearance of the liver identified on previous CT." Electronically Signed   By: Franki Cabot M.D.   On: 10/21/2015 19:41  10/21/2015  CLINICAL DATA:  Known hernia increasing in size. Left-sided pain for 2 months. Abnormal clearance of the liver identified on previous CT. Subsequent abdominal MRI unable to be performed for some reason. 16 mm lesion within the left kidney also described on earlier CT. EXAM: CT ABDOMEN AND PELVIS WITHOUT AND WITH CONTRAST TECHNIQUE: Multidetector CT imaging of the abdomen and pelvis was performed following the standard protocol before and following the bolus administration of intravenous contrast. CONTRAST:  173mL ISOVUE-300 IOPAMIDOL (ISOVUE-300) INJECTION 61% COMPARISON:  CTA chest and abdomen dated 02/26/2015. FINDINGS: There is abnormal enhancement along the walls of the dilated common bile duct, best seen on images 48 through 54 of series 4, suspicious for cholangiocarcinoma. There is also confluent bile duct dilatation throughout the majority of the left hepatic lobe. Hypodense mass within the far medial margin of the left hepatic lobe measures 3.3 x 2.4 cm but demonstrates no significant enhancement and is presumably a confluence of dilated bile ducts or complex cyst. There are, however, numerous smaller hypodense lesions throughout the periphery of both hepatic lobes highly suggestive of metastatic disease. Gallbladder  is moderately distended but otherwise unremarkable. Pancreas, spleen, and adrenal glands appear normal. There are multiple bilateral renal cysts, including several hyperdense hemorrhagic cysts. Largest cyst is within the right kidney measuring 8.8 cm. No renal stone or hydronephrosis. Small amount of free fluid seen within the  upper abdomen extending into the bilateral pericolic gutter regions. Additional small amount of free fluid noted within the lower pelvis. There is a large left inguinal hernia which contains small bowel but there is no evidence of bowel obstruction or inflammation within the hernia sac. No dilated small or large bowel loops. Scattered small and moderate -sized lymph nodes are seen within the abdomen and retroperitoneum. Atherosclerotic changes are seen along the walls of the normal-caliber abdominal aorta. There are small pleural effusions at each lung base. Multiple pulmonary nodules are seen at the right lung base including 9 mm and 8 mm pulmonary nodules seen on images 20 and 21 respectively of series 4. Additional scarring/fibrosis noted each lung base. Degenerative changes are seen throughout the thoracolumbar spine but no acute osseous abnormality and no evidence of osseous metastasis. IMPRESSION: 1. Abnormal enhancement along the walls of the distended common bile duct highly suspicious for cholangiocarcinoma. Recommend ERCP or MRCP for further characterization. 2. Prominent bile duct dilatation within the left hepatic lobe, further evidence for a central cholangiocarcinoma. 3. Numerous small hypodense lesions throughout the periphery of the bilateral liver lobes, highly suspicious for metastatic disease. An additional mass within the far medial aspects of the left hepatic lobe, measuring 3.3 x 2.4 cm, shows no evidence of significant enhancement and may merely represent a confluence of dilated bile ducts or complex cyst. 4. Multiple pulmonary nodules at each lung base, right greater than left, highly suspicious for pulmonary metastases. Additional small pleural effusions seen bilaterally. 5. Small amount of free fluid within the abdomen and pelvis. 6. Large left inguinal hernia which contains multiple small bowel loops, extending to the left scrotum. No evidence of associated bowel obstruction or  inflammation within this large hernia sac. 7. Numerous renal cysts bilaterally, some of which are hemorrhagic cysts. No suspicious enhancing renal mass. 8. Scattered small and moderate-sized lymph nodes within the upper abdomen and retroperitoneum, suspicious for metastatic lymphadenopathy. These results will be called to the ordering clinician or representative by the Radiologist Assistant, and communication documented in the PACS or zVision Dashboard. Electronically Signed: By: Franki Cabot M.D. On: 10/21/2015 19:10   Ct Head Wo Contrast  10/27/2015  CLINICAL DATA:  Agitated, lethargic, altered mental status. Unable to cooperate. Anticoagulated. EXAM: CT HEAD WITHOUT CONTRAST TECHNIQUE: Contiguous axial images were obtained from the base of the skull through the vertex without intravenous contrast. COMPARISON:  None. FINDINGS: There is no intracranial hemorrhage, mass or evidence of acute infarction. There is moderate generalized atrophy. There is mild chronic microvascular ischemic change. There is no significant extra-axial fluid collection. No acute intracranial findings are evident. The visible paranasal sinuses are clear. There is chronic sclerosis of the mastoid tips bilaterally. IMPRESSION: Moderate generalized atrophy and chronic microvascular changes. No acute findings. Electronically Signed   By: Andreas Newport M.D.   On: 10/27/2015 23:05   Ct Angio Chest Pe W/cm &/or Wo Cm  10/19/2015  CLINICAL DATA:  Shortness of breath, tachycardia, atrial fibrillation, former smoker, history cholangiocarcinoma EXAM: CT ANGIOGRAPHY CHEST WITH CONTRAST TECHNIQUE: Multidetector CT imaging of the chest was performed using the standard protocol during bolus administration of intravenous contrast. Multiplanar CT image reconstructions and MIPs were obtained to evaluate the  vascular anatomy. CONTRAST:  195mL OMNIPAQUE IOHEXOL 350 MG/ML SOLN IV COMPARISON:  02/26/2015 FINDINGS: Aneurysmal dilatation ascending  thoracic aorta 4.5 cm transverse image 41 previously 4.3 cm. Scattered atherosclerotic calcifications aorta and coronary arteries. Enlargement of cardiac chambers particularly atria. Numerous small BILATERAL pulmonary arterial filling defects throughout all lobes consistent with pulmonary emboli, including small saddle emboli at the RIGHT upper and RIGHT lower lobes an within LEFT lower lobe branches. Elevated RV/LV ratio = 1.25 consistent with RIGHT heart strain. Enhancing nodule within the mid thoracic esophagus on the previous exam no longer identified though the mid the esophagus does demonstrate mild wall thickening. No thoracic adenopathy. Visualized upper abdomen significant for mild ascites. Small BILATERAL pleural effusions RIGHT greater than LEFT. Atelectasis RIGHT lower lobe. Numerous new nodular foci in both lungs as well as a focus of questionable nodularity versus infiltrate in the anterior RIGHT upper lobe 13 mm diameter image 43. Segmental bronchiectasis anterior basal RIGHT lower lobe. No infiltrate or pneumothorax. No acute osseous findings. Review of the MIP images confirms the above findings. IMPRESSION: Multiple BILATERAL pulmonary emboli. Positive for acute PE with CT evidence of right heart strain (RV/LV Ratio = 1.25) consistent with at least submassive (intermediate risk) PE. The presence of right heart strain has been associated with an increased risk of morbidity and mortality. Please activate Code PE by paging [phone]. Enlargement of cardiac chambers particularly both atria. Aneurysmal dilatation ascending thoracic aorta, recommendation below. Ascending thoracic aortic aneurysm. Recommend semi-annual imaging followup by CTA or MRA and referral to cardiothoracic surgery if not already obtained. This recommendation follows 2010 ACCF/AHA/AATS/ACR/ASA/SCA/SCAI/SIR/STS/SVM Guidelines for the Diagnosis and Management of Patients With Thoracic Aortic Disease. Circulation. 2010; 121:  L892-J194 New multiple BILATERAL pulmonary nodules suspicious for metastatic disease in this patient with a history of cholangiocarcinoma. Small BILATERAL pleural effusions with upper abdominal ascites noted. Critical Value/emergent results were called by telephone at the time of interpretation on 10/23/2015 at 1644 hours to Dr. Leo Grosser , who verbally acknowledged these results. Electronically Signed   By: Lavonia Dana M.D.   On: 10/17/2015 16:44   Mr 3d Recon At Scanner  10/25/2015  CLINICAL DATA:  Suspected cholangiocarcinoma, elevated CA 19-9 EXAM: MRI ABDOMEN WITHOUT AND WITH CONTRAST (INCLUDING MRCP) TECHNIQUE: Multiplanar multisequence MR imaging of the abdomen was performed both before and after the administration of intravenous contrast. Heavily T2-weighted images of the biliary and pancreatic ducts were obtained, and three-dimensional MRCP images were rendered by post processing. CONTRAST:  70mL MULTIHANCE GADOBENATE DIMEGLUMINE 529 MG/ML IV SOLN COMPARISON:  CT abdomen pelvis dated 10/21/2015 and 02/26/2015 FINDINGS: Severely motion degraded images. Lower chest: Small bilateral pleural effusions, right greater than left. Cardiomegaly. Hepatobiliary: Focal biliary dilatation in the lateral segment left hepatic lobe (series 10/image 24; series 1104/image 40), progressive from 02/2015, worrisome for underlying intrahepatic cholangiocarcinoma. Numerous rim enhancing lesions throughout both lobes, measuring up to 11 mm (series 1104/image 30), new from 02/2015, compatible with metastases. Distended gallbladder with multiple layering gallstones. Dilated common duct, measuring 12 mm (series 400/ image 82). At least one 10 mm mid/distal CBD stone is present (series 3/ image 39). Pancreas: Within normal limits. Spleen: Within normal limits. Adrenals/Urinary Tract: Adrenal glands are within normal limits. Multiple bilateral renal cysts measuring up to 8.1 cm in the anterior right lower kidney (series 10/  image 41). Hemorrhagic left renal cysts measuring up to 1.7 cm (series 10/ image 35). No hydronephrosis. Stomach/Bowel: Stomach and visualized bowel are grossly unremarkable. Vascular/Lymphatic: No evidence of abdominal aortic  aneurysm. Suspected upper abdominal/retroperitoneal lymph nodes are better evaluated on CT due to motion degradation. Other: Small volume abdominal ascites. Musculoskeletal: No focal osseous lesions. IMPRESSION: Severely motion degraded images. Progressive focal intrahepatic biliary dilation in the lateral segment left hepatic lobe, worrisome for intrahepatic cholangiocarcinoma. Numerous new rim enhancing lesions throughout both lobes, measuring up to 11 mm, compatible with metastases. Suspected upper abdominal/retroperitoneal lymph nodes are better evaluated on recent CT due to motion degradation. Cholelithiasis with choledocholithiasis. Common duct measures 11 mm and is notable for at least one mid/distal CBD stone measuring 10 mm. Additional ancillary findings as above. Electronically Signed   By: Julian Hy M.D.   On: 10/25/2015 13:39   US Biopsy  10/27/2015  INDICATION: No known primary, now with concern for cholangiocarcinoma metastatic to the liver. Please perform ultrasound-guided liver lesion biopsy for tissue diagnostic purposes. Given the presence of small volume intra-abdominal ascites, a diagnostic and therapeutic paracentesis was performed prior to the ultrasound-guided liver lesion biopsy EXAM: 1. ULTRASOUND-GUIDED PARACENTESIS 2. ULTRASOUND GUIDED LIVER LESION BIOPSY COMPARISON:  Abdominal MRI - 10/25/2015 semi abdominal CT - 10/21/2015; 02/26/2015 MEDICATIONS: Fentanyl 25 mcg IV; Versed 1 mg IV ANESTHESIA/SEDATION: Total Moderate Sedation time 15 minutes COMPLICATIONS: None immediate PROCEDURE: Informed written consent was obtained from the patient after a discussion of the risks, benefits and alternatives to treatment. The patient understands and consents the  procedure. A timeout was performed prior to the initiation of the procedure. Ultrasound scanning was performed of the right upper abdominal quadrant demonstrates a trace amount of intra-abdominal ascites as well as several ill-defined hypo attenuating liver lesions compatible with the findings seen on preceding abdominal CT and abdominal MRI. The right upper abdominal quadrant was prepped and draped in the usual sterile fashion. The overlying soft tissues were anesthetized with 1% lidocaine with epinephrine. Under direct ultrasound guidance, a Yueh sheath needle was advanced into the peritoneal space and a paracentesis was performed yielding approximately 450 cc of serous, slightly bloody peritoneal fluid. All aspirated ascitic fluid was capped and sent to the laboratory for cytologic analysis. Given sonographic window, a dominant approximately 0.8 x 0.7 cm nodule within the right lobe of the liver was targeted for biopsy (image 12). A 17 gauge, 6.8 cm co-axial needle was advanced into a peripheral aspect of the lesion. This was followed by the acquisition of 3 core biopsies with an 18 gauge core device under direct ultrasound guidance. Note, the examination was degraded secondary to patient's marked respiratory excursion and difficulty following breathing instructions. The coaxial needle tract was embolized with a small amount of Gel-Foam slurry and superficial hemostasis was obtained with manual compression. Post procedural scanning was negative for definitive area of hemorrhage or additional complication. A dressing was placed. The patient tolerated the procedure well without immediate post procedural complication. IMPRESSION: 1. Technically successful ultrasound guided core needle biopsy of dominant indeterminate nodule within the right lobe of the liver. Note, the examination was degraded secondary to patient's marked respiratory excursion and difficulty following breathing instructions. 2. Successful  ultrasound-guided paracentesis yielding 450 cc of serous, slightly bloody peritoneal fluid. All aspirated fluid was capped and sent to the laboratory for cytologic analysis. Electronically Signed   By: Sandi Mariscal M.D.   On: 10/27/2015 17:17   US Paracentesis  10/27/2015  INDICATION: No known primary, now with concern for cholangiocarcinoma metastatic to the liver. Please perform ultrasound-guided liver lesion biopsy for tissue diagnostic purposes. Given the presence of small volume intra-abdominal ascites, a diagnostic and therapeutic paracentesis  was performed prior to the ultrasound-guided liver lesion biopsy EXAM: 1. ULTRASOUND-GUIDED PARACENTESIS 2. ULTRASOUND GUIDED LIVER LESION BIOPSY COMPARISON:  Abdominal MRI - 10/25/2015 semi abdominal CT - 10/21/2015; 02/26/2015 MEDICATIONS: Fentanyl 25 mcg IV; Versed 1 mg IV ANESTHESIA/SEDATION: Total Moderate Sedation time 15 minutes COMPLICATIONS: None immediate PROCEDURE: Informed written consent was obtained from the patient after a discussion of the risks, benefits and alternatives to treatment. The patient understands and consents the procedure. A timeout was performed prior to the initiation of the procedure. Ultrasound scanning was performed of the right upper abdominal quadrant demonstrates a trace amount of intra-abdominal ascites as well as several ill-defined hypo attenuating liver lesions compatible with the findings seen on preceding abdominal CT and abdominal MRI. The right upper abdominal quadrant was prepped and draped in the usual sterile fashion. The overlying soft tissues were anesthetized with 1% lidocaine with epinephrine. Under direct ultrasound guidance, a Yueh sheath needle was advanced into the peritoneal space and a paracentesis was performed yielding approximately 450 cc of serous, slightly bloody peritoneal fluid. All aspirated ascitic fluid was capped and sent to the laboratory for cytologic analysis. Given sonographic window, a dominant  approximately 0.8 x 0.7 cm nodule within the right lobe of the liver was targeted for biopsy (image 12). A 17 gauge, 6.8 cm co-axial needle was advanced into a peripheral aspect of the lesion. This was followed by the acquisition of 3 core biopsies with an 18 gauge core device under direct ultrasound guidance. Note, the examination was degraded secondary to patient's marked respiratory excursion and difficulty following breathing instructions. The coaxial needle tract was embolized with a small amount of Gel-Foam slurry and superficial hemostasis was obtained with manual compression. Post procedural scanning was negative for definitive area of hemorrhage or additional complication. A dressing was placed. The patient tolerated the procedure well without immediate post procedural complication. IMPRESSION: 1. Technically successful ultrasound guided core needle biopsy of dominant indeterminate nodule within the right lobe of the liver. Note, the examination was degraded secondary to patient's marked respiratory excursion and difficulty following breathing instructions. 2. Successful ultrasound-guided paracentesis yielding 450 cc of serous, slightly bloody peritoneal fluid. All aspirated fluid was capped and sent to the laboratory for cytologic analysis. Electronically Signed   By: Sandi Mariscal M.D.   On: 10/27/2015 17:17   Dg Chest Port 1 View  10/27/2015  CLINICAL DATA:  Hypoxia EXAM: PORTABLE CHEST - 1 VIEW COMPARISON:  10/10/2015 FINDINGS: Cardiac shadow is again enlarged. Increased density is noted over the right hemi thorax consistent with posteriorly layering effusion. Increased vascular congestion is noted. No focal confluent infiltrate is seen. IMPRESSION: Increasing central vascular congestion Right-sided pleural effusion. Electronically Signed   By: Inez Catalina M.D.   On: 10/27/2015 22:50   Mr Abd W/wo Cm/mrcp  10/25/2015  CLINICAL DATA:  Suspected cholangiocarcinoma, elevated CA 19-9 EXAM: MRI  ABDOMEN WITHOUT AND WITH CONTRAST (INCLUDING MRCP) TECHNIQUE: Multiplanar multisequence MR imaging of the abdomen was performed both before and after the administration of intravenous contrast. Heavily T2-weighted images of the biliary and pancreatic ducts were obtained, and three-dimensional MRCP images were rendered by post processing. CONTRAST:  1mL MULTIHANCE GADOBENATE DIMEGLUMINE 529 MG/ML IV SOLN COMPARISON:  CT abdomen pelvis dated 10/21/2015 and 02/26/2015 FINDINGS: Severely motion degraded images. Lower chest: Small bilateral pleural effusions, right greater than left. Cardiomegaly. Hepatobiliary: Focal biliary dilatation in the lateral segment left hepatic lobe (series 10/image 24; series 1104/image 40), progressive from 02/2015, worrisome for underlying intrahepatic cholangiocarcinoma. Numerous rim  enhancing lesions throughout both lobes, measuring up to 11 mm (series 1104/image 30), new from 02/2015, compatible with metastases. Distended gallbladder with multiple layering gallstones. Dilated common duct, measuring 12 mm (series 400/ image 82). At least one 10 mm mid/distal CBD stone is present (series 3/ image 39). Pancreas: Within normal limits. Spleen: Within normal limits. Adrenals/Urinary Tract: Adrenal glands are within normal limits. Multiple bilateral renal cysts measuring up to 8.1 cm in the anterior right lower kidney (series 10/ image 41). Hemorrhagic left renal cysts measuring up to 1.7 cm (series 10/ image 35). No hydronephrosis. Stomach/Bowel: Stomach and visualized bowel are grossly unremarkable. Vascular/Lymphatic: No evidence of abdominal aortic aneurysm. Suspected upper abdominal/retroperitoneal lymph nodes are better evaluated on CT due to motion degradation. Other: Small volume abdominal ascites. Musculoskeletal: No focal osseous lesions. IMPRESSION: Severely motion degraded images. Progressive focal intrahepatic biliary dilation in the lateral segment left hepatic lobe, worrisome  for intrahepatic cholangiocarcinoma. Numerous new rim enhancing lesions throughout both lobes, measuring up to 11 mm, compatible with metastases. Suspected upper abdominal/retroperitoneal lymph nodes are better evaluated on recent CT due to motion degradation. Cholelithiasis with choledocholithiasis. Common duct measures 11 mm and is notable for at least one mid/distal CBD stone measuring 10 mm. Additional ancillary findings as above. Electronically Signed   By: Julian Hy M.D.   On: 10/25/2015 13:39    Microbiology: Recent Results (from the past 240 hour(s))  MRSA PCR Screening     Status: None   Collection Time: 09/28/2015  6:48 PM  Result Value Ref Range Status   MRSA by PCR NEGATIVE NEGATIVE Final    Comment:        The GeneXpert MRSA Assay (FDA approved for NASAL specimens only), is one component of a comprehensive MRSA colonization surveillance program. It is not intended to diagnose MRSA infection nor to guide or monitor treatment for MRSA infections.      Labs: Basic Metabolic Panel:  Recent Labs Lab 10/20/2015 1454 10/25/15 0303 10/26/15 0445 10/27/15 0504 10/27/15 2310  NA 133* 135 136 135 138  K 4.1 4.0 3.9 3.7 4.1  CL 103 103 105 105 107  CO2 21* 24 25 24 22   GLUCOSE 115* 111* 127* 115* 130*  BUN 35* 36* 29* 26* 31*  CREATININE 0.72 0.76 0.72 0.70 0.64  CALCIUM 8.7* 8.4* 8.3* 8.3* 8.1*  MG  --   --   --   --  1.4*   Liver Function Tests:  Recent Labs Lab 10/23/15 1225 10/23/2015 1454 10/25/15 0303 10/27/15 0504 10/27/15 2310  AST 38* 47* 35 35 74*  ALT 28 30 26 25  40  ALKPHOS 420* 440* 401* 386* 428*  BILITOT 1.5* 1.4* 1.0 1.5* 4.2*  PROT 6.3 6.2* 5.8* 6.0* 5.4*  ALBUMIN 3.1* 3.0* 2.7* 2.8* 2.6*   No results for input(s): LIPASE, AMYLASE in the last 168 hours. No results for input(s): AMMONIA in the last 168 hours. CBC:  Recent Labs Lab 10/23/15 1225 10/04/2015 1454 10/25/15 0303 10/26/15 0445 10/27/15 0504 10/27/15 2310  WBC 13.3*  12.8* 12.1* 12.6* 14.0* 17.0*  NEUTROABS 11.2* 10.8*  --   --   --   --   HGB 10.7* 9.8* 9.2* 9.2* 9.4* 8.6*  HCT 32.2* 29.5* 28.4* 28.7* 28.9* 26.3*  MCV 82.1 82.9 83.8 84.7 83.8 84.0  PLT 51.0 Repeated and verified X2.* 54* 76* 104* 109* 79*   Cardiac Enzymes:  Recent Labs Lab 10/27/15 2310  TROPONINI 0.43*   BNP: BNP (last 3 results)  Recent  Labs  10/15/2015 1530  BNP 490.6*    ProBNP (last 3 results) No results for input(s): PROBNP in the last 8760 hours.  CBG: No results for input(s): GLUCAP in the last 168 hours.     SignedFlorencia Reasons MD, PhD  [facility] 11/05/2015, 8:12 AM

## 2015-11-27 DEATH — deceased

## 2016-01-23 ENCOUNTER — Ambulatory Visit: Payer: Medicare Other | Admitting: Cardiovascular Disease

## 2016-04-24 IMAGING — CT CT HEAD W/O CM
2 series · 16 of 30 positions shown, 20 images · non-contrast
Comparison: None.

CLINICAL DATA: Agitated, lethargic, altered mental status. Unable
to cooperate. Anticoagulated.

EXAM:
CT HEAD WITHOUT CONTRAST
TECHNIQUE: Contiguous axial images were obtained from the base of the skull
through the vertex without intravenous contrast.

[Series 2: headseq 4.8 h45s · axial · 0.43mm/px · z∈[-146,+2]mm · 14 of 36 slices shown, 18 images]
[im 3/36  brain]
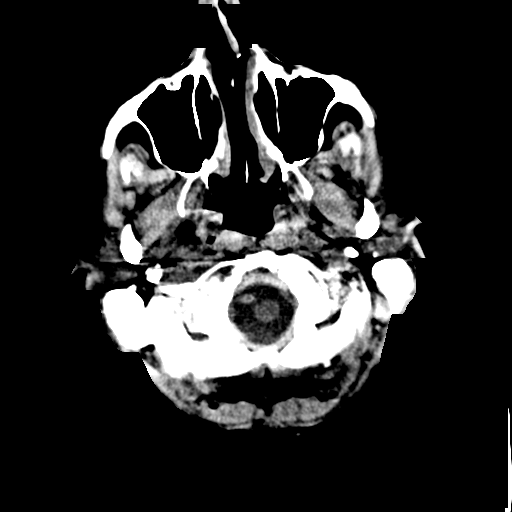
[im 3/36  bone]
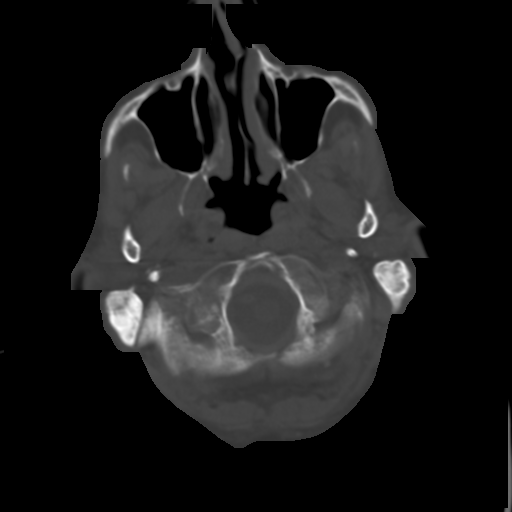
[im 5/36  brain]
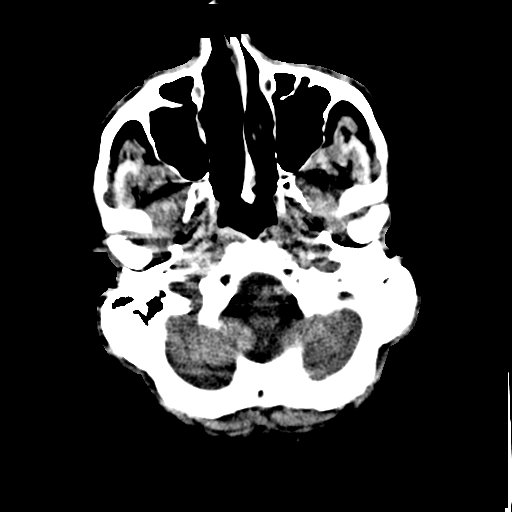
[im 8/36  brain]
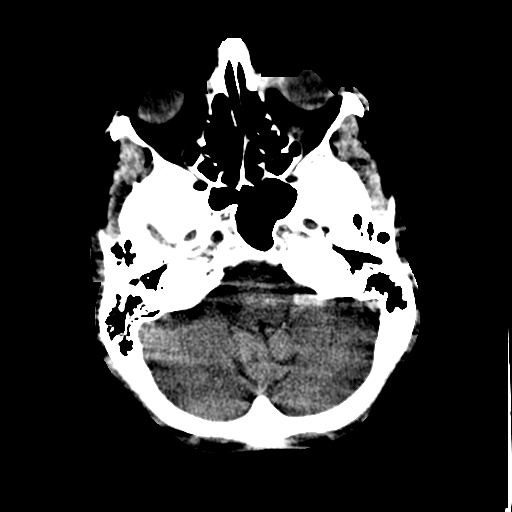
[im 10/36  brain]
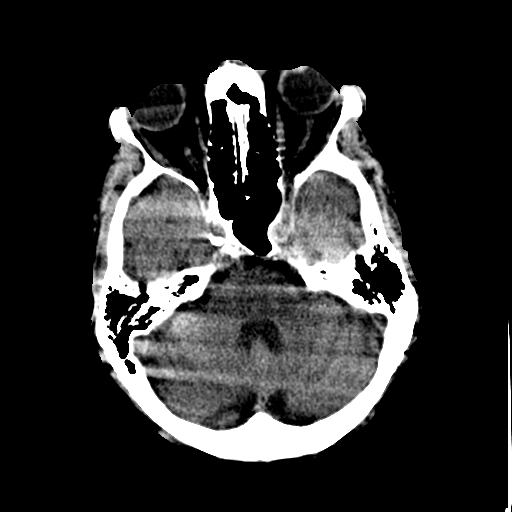
[im 12/36  brain]
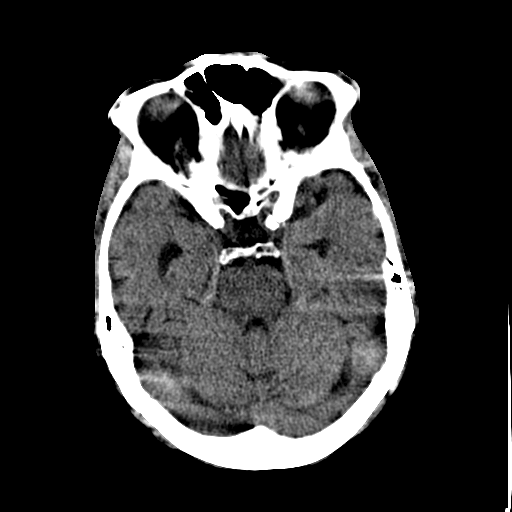
[im 12/36  bone]
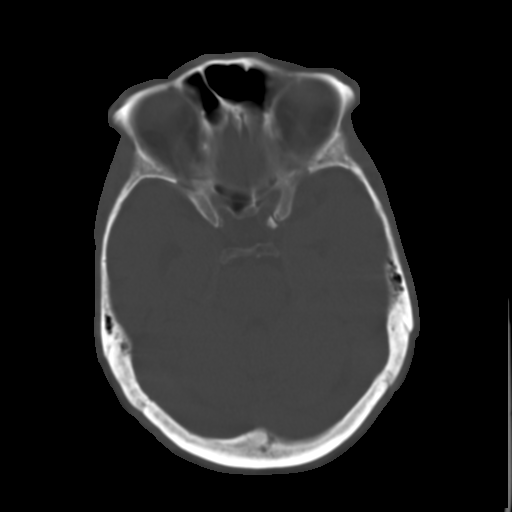
[im 15/36  brain]
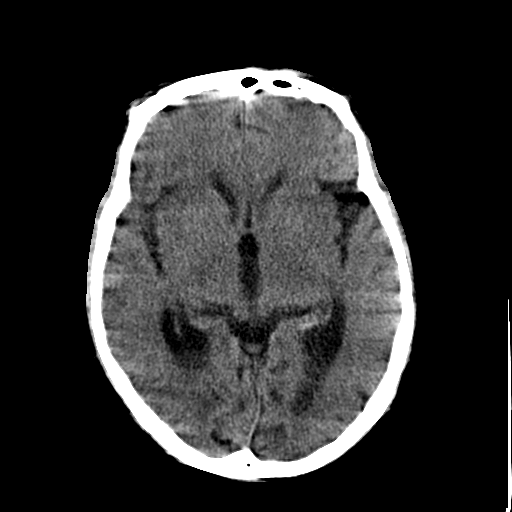
[im 17/36  brain]
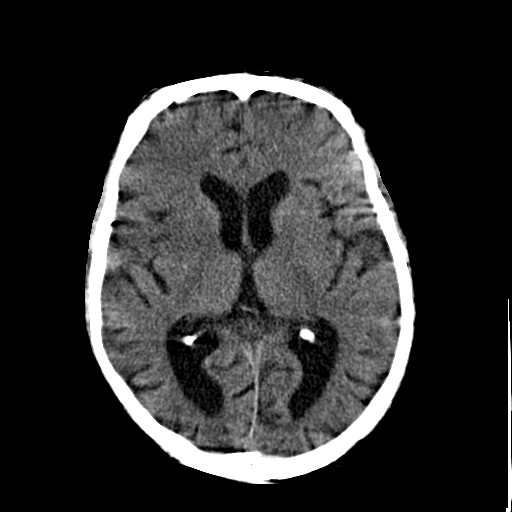
[im 19/36  brain]
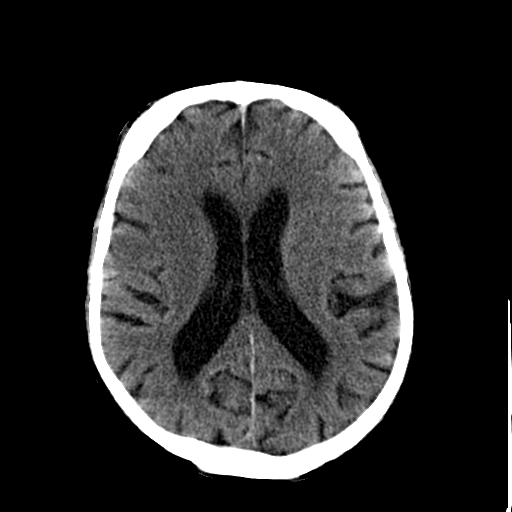
[im 22/36  brain]
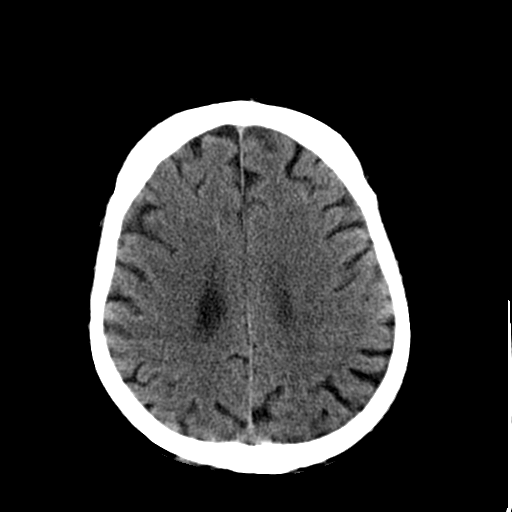
[im 22/36  bone]
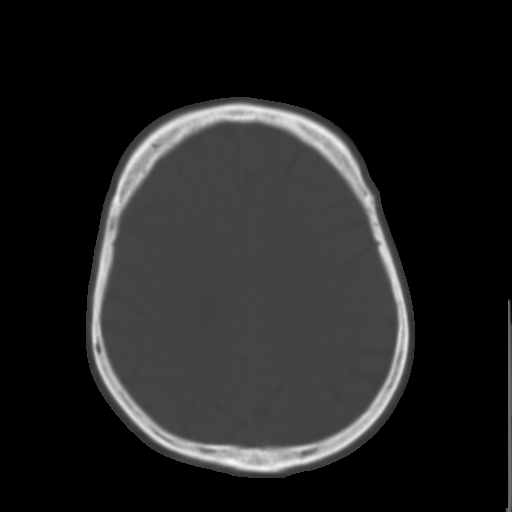
[im 24/36  brain]
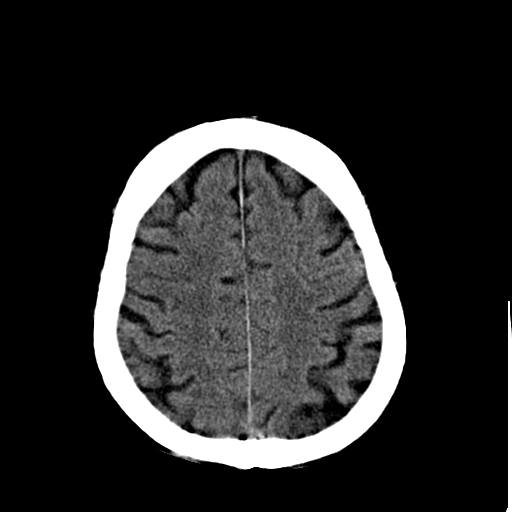
[im 26/36  brain]
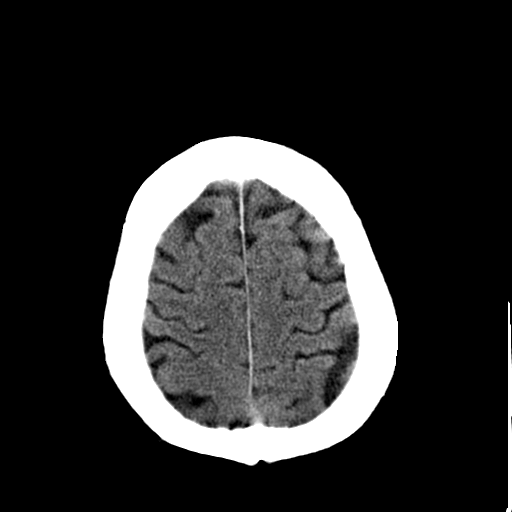
[im 29/36  brain]
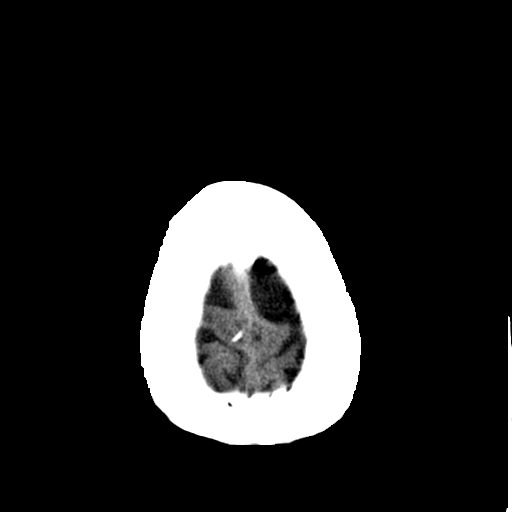
[im 31/36  brain]
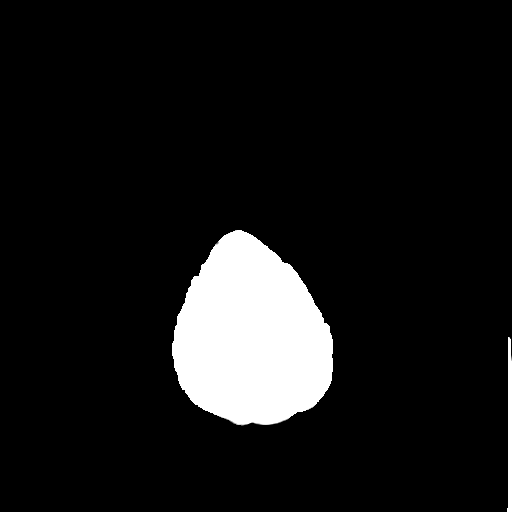
[im 31/36  bone]
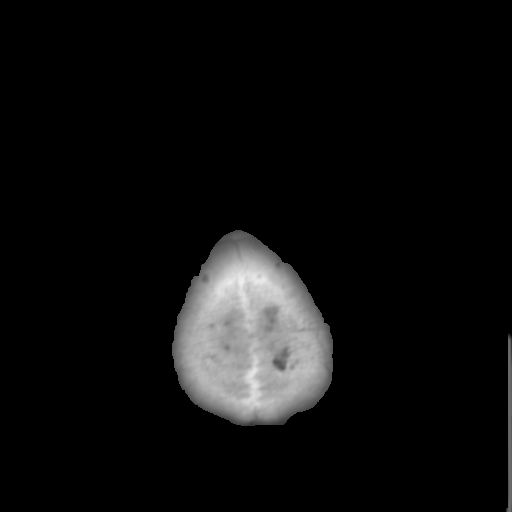
[im 33/36  brain]
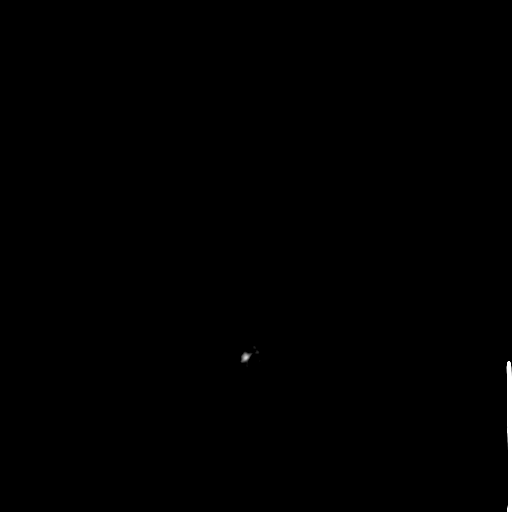

[Series 4: head 5.0 h45s · axial · 0.43mm/px · z∈[-179,-154]mm · 2 of 33 slices shown]
[im 3/33  brain]
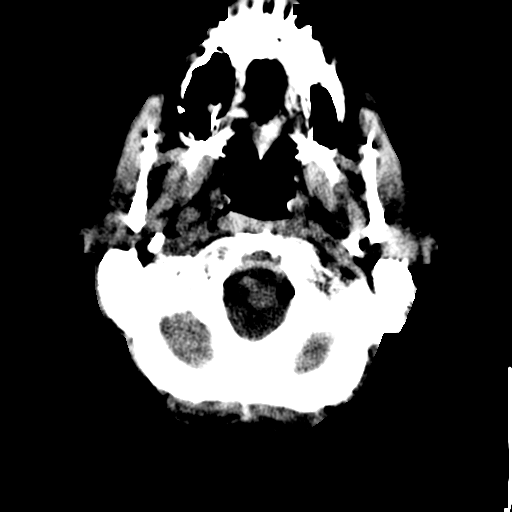
[im 8/33  brain]
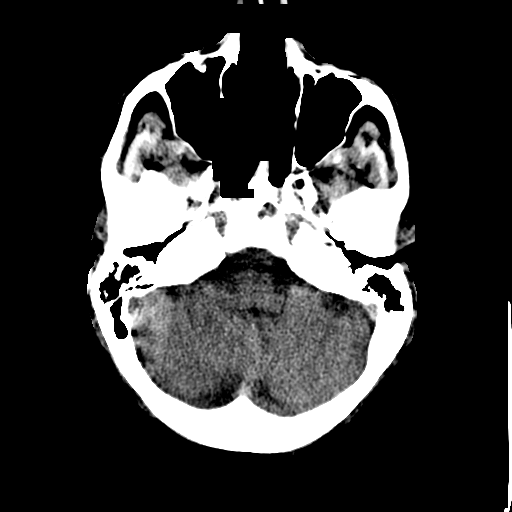

[16 of 30 positions shown; findings below may reference images not displayed]

FINDINGS: There is no intracranial hemorrhage, mass or evidence of acute
infarction. There is moderate generalized atrophy. There is mild
chronic microvascular ischemic change. There is no significant
extra-axial fluid collection.

No acute intracranial findings are evident. The visible paranasal
sinuses are clear. There is chronic sclerosis of the mastoid tips
bilaterally.
IMPRESSION: Moderate generalized atrophy and chronic microvascular changes. No
acute findings.
